# Patient Record
Sex: Female | Born: 1940 | Race: White | Hispanic: No | Smoking: Never smoker
Health system: Southern US, Community
[De-identification: ages and names within clinical notes are randomized; demographics above are authoritative.]

## PROBLEM LIST (undated history)

## (undated) DIAGNOSIS — M179 Osteoarthritis of knee, unspecified: Secondary | ICD-10-CM

## (undated) DIAGNOSIS — E041 Nontoxic single thyroid nodule: Secondary | ICD-10-CM

## (undated) DIAGNOSIS — R739 Hyperglycemia, unspecified: Secondary | ICD-10-CM

## (undated) DIAGNOSIS — F32A Depression, unspecified: Secondary | ICD-10-CM

## (undated) DIAGNOSIS — I10 Essential (primary) hypertension: Secondary | ICD-10-CM

## (undated) DIAGNOSIS — M109 Gout, unspecified: Secondary | ICD-10-CM

## (undated) DIAGNOSIS — F419 Anxiety disorder, unspecified: Secondary | ICD-10-CM

## (undated) DIAGNOSIS — M12819 Other specific arthropathies, not elsewhere classified, unspecified shoulder: Secondary | ICD-10-CM

## (undated) DIAGNOSIS — F329 Major depressive disorder, single episode, unspecified: Secondary | ICD-10-CM

## (undated) DIAGNOSIS — I517 Cardiomegaly: Secondary | ICD-10-CM

## (undated) DIAGNOSIS — J8410 Pulmonary fibrosis, unspecified: Secondary | ICD-10-CM

## (undated) DIAGNOSIS — H9190 Unspecified hearing loss, unspecified ear: Secondary | ICD-10-CM

## (undated) DIAGNOSIS — I8390 Asymptomatic varicose veins of unspecified lower extremity: Secondary | ICD-10-CM

## (undated) DIAGNOSIS — R42 Dizziness and giddiness: Secondary | ICD-10-CM

## (undated) DIAGNOSIS — M1710 Unilateral primary osteoarthritis, unspecified knee: Secondary | ICD-10-CM

## (undated) DIAGNOSIS — I4891 Unspecified atrial fibrillation: Secondary | ICD-10-CM

## (undated) DIAGNOSIS — J309 Allergic rhinitis, unspecified: Secondary | ICD-10-CM

## (undated) DIAGNOSIS — M779 Enthesopathy, unspecified: Secondary | ICD-10-CM

## (undated) DIAGNOSIS — E782 Mixed hyperlipidemia: Secondary | ICD-10-CM

## (undated) DIAGNOSIS — E669 Obesity, unspecified: Secondary | ICD-10-CM

## (undated) DIAGNOSIS — G4710 Hypersomnia, unspecified: Secondary | ICD-10-CM

## (undated) DIAGNOSIS — I340 Nonrheumatic mitral (valve) insufficiency: Secondary | ICD-10-CM

## (undated) DIAGNOSIS — E559 Vitamin D deficiency, unspecified: Secondary | ICD-10-CM

## (undated) DIAGNOSIS — D649 Anemia, unspecified: Secondary | ICD-10-CM

## (undated) DIAGNOSIS — R918 Other nonspecific abnormal finding of lung field: Secondary | ICD-10-CM

## (undated) DIAGNOSIS — I6529 Occlusion and stenosis of unspecified carotid artery: Secondary | ICD-10-CM

## (undated) HISTORY — DX: Other specific arthropathies, not elsewhere classified, unspecified shoulder: M12.819

## (undated) HISTORY — DX: Osteoarthritis of knee, unspecified: M17.9

## (undated) HISTORY — DX: Pulmonary fibrosis, unspecified: J84.10

## (undated) HISTORY — PX: APPENDECTOMY: SHX54

## (undated) HISTORY — DX: Dizziness and giddiness: R42

## (undated) HISTORY — DX: Other nonspecific abnormal finding of lung field: R91.8

## (undated) HISTORY — DX: Hypersomnia, unspecified: G47.10

## (undated) HISTORY — DX: Unspecified hearing loss, unspecified ear: H91.90

## (undated) HISTORY — DX: Mixed hyperlipidemia: E78.2

## (undated) HISTORY — DX: Occlusion and stenosis of unspecified carotid artery: I65.29

## (undated) HISTORY — DX: Nontoxic single thyroid nodule: E04.1

## (undated) HISTORY — DX: Cardiomegaly: I51.7

## (undated) HISTORY — DX: Unilateral primary osteoarthritis, unspecified knee: M17.10

## (undated) HISTORY — DX: Vitamin D deficiency, unspecified: E55.9

## (undated) HISTORY — DX: Unspecified atrial fibrillation: I48.91

## (undated) HISTORY — DX: Allergic rhinitis, unspecified: J30.9

## (undated) HISTORY — DX: Obesity, unspecified: E66.9

## (undated) HISTORY — DX: Major depressive disorder, single episode, unspecified: F32.9

## (undated) HISTORY — DX: Anxiety disorder, unspecified: F41.9

## (undated) HISTORY — DX: Hyperglycemia, unspecified: R73.9

## (undated) HISTORY — PX: MELANOMA EXCISION: SHX5266

## (undated) HISTORY — DX: Enthesopathy, unspecified: M77.9

## (undated) HISTORY — DX: Gout, unspecified: M10.9

## (undated) HISTORY — DX: Depression, unspecified: F32.A

## (undated) HISTORY — DX: Essential (primary) hypertension: I10

## (undated) HISTORY — DX: Nonrheumatic mitral (valve) insufficiency: I34.0

## (undated) HISTORY — DX: Asymptomatic varicose veins of unspecified lower extremity: I83.90

## (undated) HISTORY — DX: Anemia, unspecified: D64.9

---

## 1998-08-12 ENCOUNTER — Emergency Department (HOSPITAL_COMMUNITY): Admission: EM | Admit: 1998-08-12 | Discharge: 1998-08-12 | Payer: Self-pay | Admitting: Emergency Medicine

## 2014-08-11 DIAGNOSIS — M179 Osteoarthritis of knee, unspecified: Secondary | ICD-10-CM | POA: Insufficient documentation

## 2014-08-11 DIAGNOSIS — M1712 Unilateral primary osteoarthritis, left knee: Secondary | ICD-10-CM | POA: Insufficient documentation

## 2014-08-11 DIAGNOSIS — M75102 Unspecified rotator cuff tear or rupture of left shoulder, not specified as traumatic: Secondary | ICD-10-CM | POA: Insufficient documentation

## 2014-08-11 DIAGNOSIS — M1711 Unilateral primary osteoarthritis, right knee: Secondary | ICD-10-CM | POA: Insufficient documentation

## 2016-07-12 DIAGNOSIS — I890 Lymphedema, not elsewhere classified: Secondary | ICD-10-CM | POA: Insufficient documentation

## 2016-11-15 ENCOUNTER — Encounter: Payer: Self-pay | Admitting: Family Medicine

## 2016-11-15 ENCOUNTER — Ambulatory Visit (INDEPENDENT_AMBULATORY_CARE_PROVIDER_SITE_OTHER): Payer: MEDICARE | Admitting: Family Medicine

## 2016-11-15 DIAGNOSIS — M545 Low back pain: Secondary | ICD-10-CM

## 2016-11-15 DIAGNOSIS — G8929 Other chronic pain: Secondary | ICD-10-CM | POA: Diagnosis not present

## 2016-11-15 NOTE — Patient Instructions (Signed)
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 Ok to take tylenol for baseline pain relief (1-2 extra strength tabs 3x/day) Aleve 1-2 tabs twice a day with food for pain and inflammation - if this isn't helping you as much as the ibuprofen you can go back to taking this. Stay as active as possible. Do home exercises and stretches as directed - hold each for 20-30 seconds and do each one three times. Consider massage, chiropractor, physical therapy, and/or acupuncture. Physical therapy has been shown to be helpful while the others have mixed results - let me know if you want to do physical therapy before I see you back. Strengthening of low back muscles, abdominal musculature are key for long term pain relief. Follow up with me in 5-6 weeks.

## 2016-11-17 ENCOUNTER — Encounter: Payer: Self-pay | Admitting: Family Medicine

## 2016-11-17 DIAGNOSIS — M545 Low back pain, unspecified: Secondary | ICD-10-CM | POA: Insufficient documentation

## 2016-11-17 NOTE — Assessment & Plan Note (Signed)
history and exam suggest degenerative changes with lumbar spasms.  Discussed options - she would like to start with home exercise program which was reviewed today.  Discussed tylenol, aleve as well.  Consider physical therapy if not improving as expected.  Consider radiographs as well.  F/u in 5-6 weeks.

## 2016-11-17 NOTE — Progress Notes (Signed)
PCP: Coralee Ruduran, Michael R, PA-C  Subjective:   HPI: Patient is a 76 y.o. female here for low back pain.  Patient reports she's had about 1 year of low back pain. Seems to be worse since over the summer. Bothers with all movements, worse in the morning. No acute trauma or injury. Better with sitting. Pain is 0/10 currently but can be up to 10/10 and sharp. No radiation into legs. No numbness or tingling. No bowel/bladder dysfunction. Had x-rays several years ago but unsure of results. Tried ibuprofen. No history of osteoporosis and reports bone densities have been very good.  Past Medical History:  Diagnosis Date  . Depression   . Hypertension     No current outpatient prescriptions on file prior to visit.   No current facility-administered medications on file prior to visit.     No past surgical history on file.  Allergies  Allergen Reactions  . Statins   . Sulfa Antibiotics     Social History   Social History  . Marital status: Divorced    Spouse name: N/A  . Number of children: N/A  . Years of education: N/A   Occupational History  . Not on file.   Social History Main Topics  . Smoking status: Never Smoker  . Smokeless tobacco: Never Used  . Alcohol use Not on file  . Drug use: Unknown  . Sexual activity: Not on file   Other Topics Concern  . Not on file   Social History Narrative  . No narrative on file    No family history on file.  BP 131/84   Pulse 76   Ht 5\' 7"  (1.702 m)   Wt 230 lb (104.3 kg)   BMI 36.02 kg/m   Review of Systems: See HPI above.     Objective:  Physical Exam:  Gen: NAD, comfortable in exam room  Back: No gross deformity, scoliosis. TTP minimally paraspinal lumbar regions.  No midline or bony TTP. FROM. Strength LEs 5/5 all muscle groups.   2+ MSRs in patellar and achilles tendons, equal bilaterally. Negative SLRs. Sensation intact to light touch bilaterally. Negative logroll bilateral hips Negative fabers  and piriformis stretches.   Assessment & Plan:  1. Low back pain - history and exam suggest degenerative changes with lumbar spasms.  Discussed options - she would like to start with home exercise program which was reviewed today.  Discussed tylenol, aleve as well.  Consider physical therapy if not improving as expected.  Consider radiographs as well.  F/u in 5-6 weeks.

## 2016-12-20 ENCOUNTER — Ambulatory Visit (INDEPENDENT_AMBULATORY_CARE_PROVIDER_SITE_OTHER): Payer: MEDICARE | Admitting: Family Medicine

## 2016-12-20 ENCOUNTER — Encounter: Payer: Self-pay | Admitting: Family Medicine

## 2016-12-20 DIAGNOSIS — G8929 Other chronic pain: Secondary | ICD-10-CM | POA: Diagnosis not present

## 2016-12-20 DIAGNOSIS — M545 Low back pain, unspecified: Secondary | ICD-10-CM

## 2016-12-20 NOTE — Patient Instructions (Signed)
Continue home exercises for 4-6 more weeks then do them as needed. Call me if you have any problems otherwise follow up as needed.

## 2016-12-22 NOTE — Assessment & Plan Note (Signed)
2/2 degenerative changes with lumbar spasms.  Doing well with home exercise program - continue this for another 4-6 weeks.  Tylenol, aleve only if needed.  Consider physical therapy if not improving.  F/u in 5-6 weeks or as needed.

## 2016-12-22 NOTE — Progress Notes (Signed)
PCP: Coralee Rud, PA-C  Subjective:   HPI: Patient is a 76 y.o. female here for low back pain.  8/28: Patient reports she's had about 1 year of low back pain. Seems to be worse since over the summer. Bothers with all movements, worse in the morning. No acute trauma or injury. Better with sitting. Pain is 0/10 currently but can be up to 10/10 and sharp. No radiation into legs. No numbness or tingling. No bowel/bladder dysfunction. Had x-rays several years ago but unsure of results. Tried ibuprofen. No history of osteoporosis and reports bone densities have been very good.  10/2: Patient reports she feels about 90-95% improved from last visit. Gets some soreness only in low back if she gets tired from doing housework, being stressed. Takes aleve or celebrex. Pain level now 0/10. No skin changes, numbness. No bowel/bladder dysfunction.  Past Medical History:  Diagnosis Date  . Depression   . Hypertension     Current Outpatient Prescriptions on File Prior to Visit  Medication Sig Dispense Refill  . furosemide (LASIX) 20 MG tablet     . montelukast (SINGULAIR) 10 MG tablet Take 10 mg by mouth daily.  6  . olmesartan (BENICAR) 40 MG tablet Take 40 mg by mouth daily.  3  . potassium chloride SA (K-DUR,KLOR-CON) 20 MEQ tablet Take 20 mEq by mouth daily.  1  . predniSONE (DELTASONE) 5 MG tablet Take 5 mg by mouth daily.  2  . sertraline (ZOLOFT) 100 MG tablet Take 100 mg by mouth daily.  3   No current facility-administered medications on file prior to visit.     No past surgical history on file.  Allergies  Allergen Reactions  . Statins   . Sulfa Antibiotics     Social History   Social History  . Marital status: Divorced    Spouse name: N/A  . Number of children: N/A  . Years of education: N/A   Occupational History  . Not on file.   Social History Main Topics  . Smoking status: Never Smoker  . Smokeless tobacco: Never Used  . Alcohol use Not on file   . Drug use: Unknown  . Sexual activity: Not on file   Other Topics Concern  . Not on file   Social History Narrative  . No narrative on file    No family history on file.  BP (!) 159/69   Pulse 82   Ht  (1.702 m)   Wt 230 lb (104.3 kg)   BMI 36.02 kg/m   Review of Systems: See HPI above.     Objective:  Physical Exam:  Gen: NAD, comfortable in exam room  Back: No gross deformity, scoliosis. No TTP currently.  No midline or bony TTP. FROM. Strength LEs 5/5 all muscle groups.   2+ MSRs in patellar and achilles tendons, equal bilaterally. Negative SLRs. Sensation intact to light touch bilaterally. Negative logroll bilateral hips   Assessment & Plan:  1. Low back pain - 2/2 degenerative changes with lumbar spasms.  Doing well with home exercise program - continue this for another 4-6 weeks.  Tylenol, aleve only if needed.  Consider physical therapy if not improving.  F/u in 5-6 weeks or as needed.

## 2018-02-12 ENCOUNTER — Telehealth: Payer: Self-pay

## 2018-02-12 NOTE — Telephone Encounter (Signed)
SENT REFERRAL TO SCHEDULING AND FILED NOTES 

## 2018-03-06 ENCOUNTER — Institutional Professional Consult (permissible substitution): Payer: MEDICARE | Admitting: Cardiology

## 2018-03-07 ENCOUNTER — Encounter: Payer: Self-pay | Admitting: Internal Medicine

## 2018-03-07 ENCOUNTER — Other Ambulatory Visit: Payer: Self-pay

## 2018-03-07 ENCOUNTER — Ambulatory Visit (INDEPENDENT_AMBULATORY_CARE_PROVIDER_SITE_OTHER): Payer: MEDICARE | Admitting: Internal Medicine

## 2018-03-07 VITALS — BP 124/80 | HR 62 | Ht 67.0 in | Wt 222.6 lb

## 2018-03-07 DIAGNOSIS — I10 Essential (primary) hypertension: Secondary | ICD-10-CM

## 2018-03-07 DIAGNOSIS — I480 Paroxysmal atrial fibrillation: Secondary | ICD-10-CM | POA: Diagnosis not present

## 2018-03-07 NOTE — Patient Instructions (Signed)
Medication Instructions:  Your physician has recommended you make the following change in your medication:  1. STOP Aspirin  * If you need a refill on your cardiac medications before your next appointment, please call your pharmacy.   Labwork: None ordered   Testing/Procedures: None ordered  Follow-Up: Your physician recommends that you schedule a follow-up appointment in: 6 weeks in the AFib clinic.   Thank you for choosing [facility]!!

## 2018-03-07 NOTE — Progress Notes (Signed)
Electrophysiology Office Note   Date:  03/07/2018   ID:  INEZE SERRAO, DOB 07-Aug-1940, MRN [id number]  PCP:  Ronnald Collum    Primary Electrophysiologist: Hillis Range, MD    Chief Complaint  Patient presents with  . Atrial Fibrillation     History of Present Illness: Jennifer Hardin is a 77 y.o. female who presents today for electrophysiology evaluation.   She was diagnosed with afib in November.  She reports symptoms of palpitations, fatigue and SOB.  She was started on eliquis.  She has a h/o htn, obesity.  Denies rheumatic fever.  She has sleep apnea workup ongoing. She is in sinus today but unaware.  She seems to have some difficulty determining symptoms of afib.  Today, she denies symptoms of palpitations, chest pain, shortness of breath, orthopnea, PND, lower extremity edema, claudication, dizziness, presyncope, syncope, bleeding, or neurologic sequela. The patient is tolerating medications without difficulties and is otherwise without complaint today.    Past Medical History:  Diagnosis Date  . Allergic rhinitis   . Anemia   . Anxiety   . Asymptomatic varicose veins   . Atrial fibrillation (HCC)   . Carotid stenosis    very mild (<39% by doppler 10/24/17 at [facility])  . Depression   . Gout   . Granulomatous lung disease (HCC)   . Hearing loss    bilateral  . Hyperglycemia   . Hypersomnia   . Hypertension    benign  . Left atrial enlargement   . Mixed hyperlipidemia   . MR (mitral regurgitation)   . Obesity   . Osteoarthritis, knee    bilateral  . Pulmonary nodules   . Recurrent vertigo   . Rotator cuff arthropathy   . Tendonitis    of elbow or forearm  . Thyroid nodule   . Vitamin D deficiency    Past Surgical History:  Procedure Laterality Date  . APPENDECTOMY    . MELANOMA EXCISION     ON BACK     Current Outpatient Medications  Medication Sig Dispense Refill  . apixaban (ELIQUIS) 5 MG TABS tablet Take 5 mg by mouth 2 (two) times  daily.    Marland Kitchen aspirin 81 MG chewable tablet Chew 81 mg by mouth daily.    . celecoxib (CELEBREX) 100 MG capsule Take 100 mg by mouth daily.    . Magnesium 250 MG TABS Take 1 tablet by mouth daily.    . montelukast (SINGULAIR) 10 MG tablet Take 10 mg by mouth daily.  6  . Multiple Vitamin (MULTIVITAMIN) tablet Take 1 tablet by mouth daily.    . nebivolol (BYSTOLIC) 5 MG tablet Take 5 mg by mouth daily.    Marland Kitchen olmesartan (BENICAR) 40 MG tablet Take 40 mg by mouth daily.  3  . Omega-3 Fatty Acids (FISH OIL) 1200 MG CAPS Take 1 capsule by mouth daily.    . potassium chloride SA (K-DUR,KLOR-CON) 20 MEQ tablet Take 20 mEq by mouth daily.  1  . predniSONE (DELTASONE) 5 MG tablet Take 5 mg by mouth daily.  2  . sertraline (ZOLOFT) 100 MG tablet Take 100 mg by mouth daily.  3   No current facility-administered medications for this visit.     Allergies:   Bee venom; Statins; and Sulfa antibiotics   Social History:  The patient  reports that she has never smoked. She has never used smokeless tobacco. She reports that she does not drink alcohol or use drugs.  Family History:  The patient's family history includes Hyperlipidemia in her sister; Hypertension in her brother.    ROS:  Please see the history of present illness.   All other systems are personally reviewed and negative.    PHYSICAL EXAM: VS:  BP 124/80   Pulse 62   Ht 5\' 7"  (1.702 m)   Wt 222 lb 9.6 oz (101 kg)   SpO2 98%   BMI 34.86 kg/m  , BMI Body mass index is 34.86 kg/m. GEN: overweight, in no acute distress  HEENT: normal  Neck: no JVD, carotid bruits, or masses Cardiac: RRR; no murmurs, rubs, or gallops,no edema  Respiratory:  clear to auscultation bilaterally, normal work of breathing GI: soft, nontender, nondistended, + BS MS: no deformity or atrophy  Skin: warm and dry  Neuro:  Strength and sensation are intact Psych: euthymic mood, full affect  EKG:  EKG is ordered today. The ekg ordered today is personally  reviewed and shows sinus rhythm with PACs, QTc 430 msec   Recent Labs: No results found for requested labs within last 8760 hours.  personally reviewed   Lipid Panel  No results found for: CHOL, TRIG, HDL, CHOLHDL, VLDL, LDLCALC, LDLDIRECT personally reviewed   Wt Readings from Last 3 Encounters:  03/07/18 222 lb 9.6 oz (101 kg)  12/20/16 230 lb (104.3 kg)  11/15/16 230 lb (104.3 kg)     Other studies personally reviewed: Additional studies/ records that were reviewed today include: Forde RadonMike Duran's notes, prior echo  Review of the above records today demonstrates: echo 01/26/18 reveals EF 60%, mild LVH, trace MR, LA 51mm' Myoview 01/30/18 reveals normal wall motion, no ischemia   ASSESSMENT AND PLAN:  1.  Paroxysmal atrial fibrillation Single episode in November in the setting of steroid taper and inhalers.  Currently in sinus and doing well.  Given her morbid obesity, age and LA size, I anticipate that she may have additional AF in the future.  We discussed lifestyle modification at length today.  chads2vasc score is at least 4.  She is on eliquis Stop ASA  2. HTN Stable No change required today  Follow-up:  AF clinic in 6 weeks.  I would reserve AAD therapy for more symptomatic and recurrent episodes.  Flecainide would be a reasonable option at that time.  Current medicines are reviewed at length with the patient today.   The patient does not have concerns regarding her medicines.  The following changes were made today:  none    Signed, Hillis RangeJames Charma Mocarski, MD  03/07/2018 3:54 PM     [facility] [address] [place] [address] [phone] (office) [phone] (fax)

## 2018-04-16 ENCOUNTER — Encounter (HOSPITAL_COMMUNITY): Payer: Self-pay | Admitting: Nurse Practitioner

## 2018-04-16 ENCOUNTER — Ambulatory Visit (HOSPITAL_COMMUNITY)
Admission: RE | Admit: 2018-04-16 | Discharge: 2018-04-16 | Disposition: A | Discharge status: Home / Self Care | Payer: MEDICARE | Source: Ambulatory Visit | Attending: Nurse Practitioner | Admitting: Nurse Practitioner

## 2018-04-16 VITALS — BP 138/86 | HR 53 | Ht 67.0 in | Wt 219.0 lb

## 2018-04-16 DIAGNOSIS — I10 Essential (primary) hypertension: Secondary | ICD-10-CM | POA: Diagnosis not present

## 2018-04-16 DIAGNOSIS — F419 Anxiety disorder, unspecified: Secondary | ICD-10-CM | POA: Insufficient documentation

## 2018-04-16 DIAGNOSIS — Z888 Allergy status to other drugs, medicaments and biological substances status: Secondary | ICD-10-CM | POA: Diagnosis not present

## 2018-04-16 DIAGNOSIS — Z7901 Long term (current) use of anticoagulants: Secondary | ICD-10-CM | POA: Diagnosis not present

## 2018-04-16 DIAGNOSIS — I480 Paroxysmal atrial fibrillation: Secondary | ICD-10-CM | POA: Diagnosis present

## 2018-04-16 DIAGNOSIS — M109 Gout, unspecified: Secondary | ICD-10-CM | POA: Diagnosis not present

## 2018-04-16 DIAGNOSIS — Z882 Allergy status to sulfonamides status: Secondary | ICD-10-CM | POA: Diagnosis not present

## 2018-04-16 DIAGNOSIS — E782 Mixed hyperlipidemia: Secondary | ICD-10-CM | POA: Diagnosis not present

## 2018-04-16 DIAGNOSIS — I6529 Occlusion and stenosis of unspecified carotid artery: Secondary | ICD-10-CM | POA: Diagnosis not present

## 2018-04-16 DIAGNOSIS — Z79899 Other long term (current) drug therapy: Secondary | ICD-10-CM | POA: Insufficient documentation

## 2018-04-16 DIAGNOSIS — Z91030 Bee allergy status: Secondary | ICD-10-CM | POA: Diagnosis not present

## 2018-04-16 DIAGNOSIS — D649 Anemia, unspecified: Secondary | ICD-10-CM | POA: Insufficient documentation

## 2018-04-16 DIAGNOSIS — J984 Other disorders of lung: Secondary | ICD-10-CM | POA: Diagnosis not present

## 2018-04-16 DIAGNOSIS — Z8249 Family history of ischemic heart disease and other diseases of the circulatory system: Secondary | ICD-10-CM | POA: Diagnosis not present

## 2018-04-16 NOTE — Progress Notes (Signed)
Primary Care Physician: Coralee Rud, PA-C Referring Physician:Dr. Bary Richard Jennifer Hardin is a 78 y.o. female with a h/o paroxysmal afib in November in the setting of steroid taper and inhalers. She had f/u with Dr. Johney Frame and was in SR. He wanted to hold off on antiarrythmic's for more symptomatic, and recurrent episodes.  She reports today that she has not had any more episodes. She is doing well on her eliquis without any issues with bleeding.   Today, she denies symptoms of palpitations, chest pain, shortness of breath, orthopnea, PND, lower extremity edema, dizziness, presyncope, syncope, or neurologic sequela. The patient is tolerating medications without difficulties and is otherwise without complaint today.   Past Medical History:  Diagnosis Date  . Allergic rhinitis   . Anemia   . Anxiety   . Asymptomatic varicose veins   . Atrial fibrillation (HCC)   . Carotid stenosis    very mild (<39% by doppler 10/24/17 at [facility])  . Depression   . Gout   . Granulomatous lung disease (HCC)   . Hearing loss    bilateral  . Hyperglycemia   . Hypersomnia   . Hypertension    benign  . Left atrial enlargement   . Mixed hyperlipidemia   . MR (mitral regurgitation)   . Obesity   . Osteoarthritis, knee    bilateral  . Pulmonary nodules   . Recurrent vertigo   . Rotator cuff arthropathy   . Tendonitis    of elbow or forearm  . Thyroid nodule   . Vitamin D deficiency    Past Surgical History:  Procedure Laterality Date  . APPENDECTOMY    . MELANOMA EXCISION     ON BACK    Current Outpatient Medications  Medication Sig Dispense Refill  . apixaban (ELIQUIS) 5 MG TABS tablet Take 5 mg by mouth 2 (two) times daily.    . Magnesium 250 MG TABS Take 1 tablet by mouth daily.    . montelukast (SINGULAIR) 10 MG tablet Take 10 mg by mouth daily.  6  . Multiple Vitamin (MULTIVITAMIN) tablet Take 1 tablet by mouth daily.    . nebivolol (BYSTOLIC) 5 MG tablet Take 5 mg by mouth  daily.    Marland Kitchen olmesartan (BENICAR) 40 MG tablet Take 40 mg by mouth daily.  3  . Omega-3 Fatty Acids (FISH OIL) 1200 MG CAPS Take 1 capsule by mouth daily.    . potassium chloride SA (K-DUR,KLOR-CON) 20 MEQ tablet Take 20 mEq by mouth daily.  1  . predniSONE (DELTASONE) 5 MG tablet Take 5 mg by mouth daily.  2  . sertraline (ZOLOFT) 100 MG tablet Take 100 mg by mouth daily.  3  . Turmeric 400 MG CAPS Take 2 capsules by mouth daily.     No current facility-administered medications for this encounter.     Allergies  Allergen Reactions  . Bee Venom     Edema   . Statins   . Sulfa Antibiotics     Social History   Socioeconomic History  . Marital status: Divorced    Spouse name: Not on file  . Number of children: Not on file  . Years of education: Not on file  . Highest education level: Not on file  Occupational History  . Not on file  Social Needs  . Financial resource strain: Not on file  . Food insecurity:    Worry: Not on file    Inability: Not on file  .  Transportation needs:    Medical: Not on file    Non-medical: Not on file  Tobacco Use  . Smoking status: Never Smoker  . Smokeless tobacco: Never Used  Substance and Sexual Activity  . Alcohol use: Never    Frequency: Never  . Drug use: Never  . Sexual activity: Not on file  Lifestyle  . Physical activity:    Days per week: Not on file    Minutes per session: Not on file  . Stress: Not on file  Relationships  . Social connections:    Talks on phone: Not on file    Gets together: Not on file    Attends religious service: Not on file    Active member of club or organization: Not on file    Attends meetings of clubs or organizations: Not on file    Relationship status: Not on file  . Intimate partner violence:    Fear of current or ex partner: Not on file    Emotionally abused: Not on file    Physically abused: Not on file    Forced sexual activity: Not on file  Other Topics Concern  . Not on file  Social  History Narrative   [place]   Substitute teacher    Family History  Problem Relation Age of Onset  . Hyperlipidemia Sister   . Hypertension Brother     ROS- All systems are reviewed and negative except as per the HPI above  Physical Exam: Vitals:   04/16/18 1333  BP: 138/86  Pulse: (!) 53  SpO2: 92%  Weight: 99.3 kg  Height: 5\' 7"  (1.702 m)   Wt Readings from Last 3 Encounters:  04/16/18 99.3 kg  03/07/18 101 kg  12/20/16 104.3 kg    Labs: No results found for: NA, K, CL, CO2, GLUCOSE, BUN, CREATININE, CALCIUM, PHOS, MG No results found for: INR No results found for: CHOL, HDL, LDLCALC, TRIG   GEN- The patient is well appearing, alert and oriented x 3 today.   Head- normocephalic, atraumatic Eyes-  Sclera clear, conjunctiva pink Ears- hearing intact Oropharynx- clear Neck- supple, no JVP Lymph- no cervical lymphadenopathy Lungs- Clear to ausculation bilaterally, normal work of breathing Heart- Regular rate and rhythm, no murmurs, rubs or gallops, PMI not laterally displaced GI- soft, NT, ND, + BS Extremities- no clubbing, cyanosis, or edema MS- no significant deformity or atrophy Skin- no rash or lesion Psych- euthymic mood, full affect Neuro- strength and sensation are intact  EKG-Sinus brady at 53 bpm, pr int 166 ms, qrs int 84 ms, qtc 394 ms   Assessment and Plan: 1.Paroxysmal afib previously in the setting of steroids and inhalers Pt not aware of any recurrent episodes Dr. Johney FrameAllred suggested flecainide if needed Continue Bystolic at 5 mg daily  2. CHA2DS2VASc score of 4 Continue eliquis 5 mg bid   F/u with PCP as scheduled and afib clinic as needed  Lupita LeashDonna C. Matthew Folksarroll, ANP-C Afib Clinic [facility] [place] [address] [place], [place] [address] [phone]

## 2019-07-22 ENCOUNTER — Telehealth: Payer: Self-pay | Admitting: Allergy and Immunology

## 2019-07-22 NOTE — Telephone Encounter (Signed)
Scheduled new patient appt for 6/10. Pt reports she is currently taking low dose predisone for years. Does she have to stop in order to be tested at new pt appt?

## 2019-07-22 NOTE — Telephone Encounter (Signed)
What condition is she taking the prednisone for? Thanks.

## 2019-07-29 ENCOUNTER — Encounter: Payer: Self-pay | Admitting: Allergy and Immunology

## 2019-07-29 ENCOUNTER — Ambulatory Visit (INDEPENDENT_AMBULATORY_CARE_PROVIDER_SITE_OTHER): Payer: MEDICARE | Admitting: Allergy and Immunology

## 2019-07-29 ENCOUNTER — Telehealth: Payer: Self-pay | Admitting: *Deleted

## 2019-07-29 ENCOUNTER — Other Ambulatory Visit: Payer: Self-pay

## 2019-07-29 VITALS — BP 136/80 | HR 63 | Temp 97.3°F | Resp 18 | Ht 63.0 in | Wt 225.4 lb

## 2019-07-29 DIAGNOSIS — J320 Chronic maxillary sinusitis: Secondary | ICD-10-CM | POA: Diagnosis not present

## 2019-07-29 DIAGNOSIS — J3089 Other allergic rhinitis: Secondary | ICD-10-CM | POA: Diagnosis not present

## 2019-07-29 DIAGNOSIS — R42 Dizziness and giddiness: Secondary | ICD-10-CM | POA: Diagnosis not present

## 2019-07-29 DIAGNOSIS — J329 Chronic sinusitis, unspecified: Secondary | ICD-10-CM | POA: Insufficient documentation

## 2019-07-29 DIAGNOSIS — H6983 Other specified disorders of Eustachian tube, bilateral: Secondary | ICD-10-CM | POA: Insufficient documentation

## 2019-07-29 DIAGNOSIS — H6993 Unspecified Eustachian tube disorder, bilateral: Secondary | ICD-10-CM

## 2019-07-29 MED ORDER — AZELASTINE-FLUTICASONE 137-50 MCG/ACT NA SUSP: 1.0000 | Freq: Two times a day (BID) | NASAL | 2 refills | Status: AC | PRN

## 2019-07-29 NOTE — Assessment & Plan Note (Signed)
   Treatment plan as outlined above for mixed rhinitis.  Continue prednisone as previously prescribed.  If problems persist or progress despite treatment plan as outlined above.  I have recommended seeing Dr. Suszanne Conners (otolaryngology) for further evaluation/treatment.

## 2019-07-29 NOTE — Assessment & Plan Note (Signed)
   Treatment plan as above.

## 2019-07-29 NOTE — Patient Instructions (Addendum)
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  Intradermal testing revealed some reactivity to major mold mix #4.  Based on the skin test results, I suspect that her symptoms are primarily nonallergic.  Aeroallergen avoidance measures have been discussed and provided in written form.  A prescription has been provided for azelastine/fluticasone nasal spray, 1 spray per nostril twice daily as needed. Proper nasal spray technique has been discussed and demonstrated.  Nasal saline lavage (NeilMed) has been recommended as needed and prior to medicated nasal sprays along with instructions for proper administration.  For thick post nasal drainage, add guaifenesin 600 mg (Mucinex)  twice daily as needed with adequate hydration as discussed.  Chronic sinusitis/eustachian tube dysfunction  Treatment plan as outlined above for mixed rhinitis.  Continue prednisone as previously prescribed.  If problems persist or progress despite treatment plan as outlined above.  I have recommended seeing Dr. Suszanne Conners (otolaryngology) for further evaluation/treatment.  Dizziness  Treatment plan as above.   If symptoms persist or progress despite treatment plan, see otolaryngologist (Dr. Suszanne Conners). Otherwise may follow up with me in 3 months or sooner if needed.  Control of Mold Allergen  Mold and fungi can grow on a variety of surfaces provided certain temperature and moisture conditions exist.  Outdoor molds grow on plants, decaying vegetation and soil.  The major outdoor mold, Alternaria and Cladosporium, are found in very high numbers during hot and dry conditions.  Generally, a late Summer - Fall peak is seen for common outdoor fungal spores.  Rain will temporarily lower outdoor mold spore count, but counts rise rapidly when the rainy period ends.  The most important indoor molds are Aspergillus and  Penicillium.  Dark, humid and poorly ventilated basements are ideal sites for mold growth.  The next most common sites of mold growth are the bathroom and the kitchen.  Outdoor [employer] 1. Use air conditioning and keep windows closed 2. Avoid exposure to decaying vegetation. 3. Avoid leaf raking. 4. Avoid grain handling. 5. Consider wearing a face mask if working in moldy areas.  Indoor Mold Control 1. Maintain humidity below 50%. 2. Clean washable surfaces with 5% bleach solution. 3. Remove sources e.g. Contaminated carpets.

## 2019-07-29 NOTE — Assessment & Plan Note (Signed)
Epicutaneous environmental tests were negative today despite a positive histamine control.  Intradermal testing revealed some reactivity to major mold mix #4.  Based on the skin test results, I suspect that her symptoms are primarily nonallergic.  Aeroallergen avoidance measures have been discussed and provided in written form.  A prescription has been provided for azelastine/fluticasone nasal spray, 1 spray per nostril twice daily as needed. Proper nasal spray technique has been discussed and demonstrated.  Nasal saline lavage (NeilMed) has been recommended as needed and prior to medicated nasal sprays along with instructions for proper administration.  For thick post nasal drainage, add guaifenesin 600 mg (Mucinex)  twice daily as needed with adequate hydration as discussed.

## 2019-07-29 NOTE — Telephone Encounter (Signed)
Ambulatory referral has been ordered for patient to see Dr. Suszanne Conners ENT for Eustachian tube dysfunction, chronic sinusitis, and dizziness.

## 2019-07-29 NOTE — Progress Notes (Signed)
New Patient Note  RE: Jennifer Hardin MRN: [id number] DOB: 21-Apr-1940 Date of Office Visit: 07/29/2019  Referring provider: Gwendel Hanson Primary care provider: Secundino Ginger, PA-C  Chief Complaint: Nasal congestion, sinus pressure, ear pressure, ringing in the ears, dizziness  History of present illness: Jennifer Hardin is a 79 y.o. female seen today in consultation requested by Isaias Cowman, PA-C.  She complains of persistent nasal congestion and ear congestion with "noise" in her head "like a fan running, just roaring in my head."  She also complains of occasional dizziness.  She reports that rhinorrhea had been "constant", however now "everything got clogged up."  These symptoms occur year-round but have been particularly troublesome over the past 2 months.  She has been taking prednisone 10 mg daily for over the past year in an attempt to control the symptoms.  In addition, she is using fluticasone nasal spray.  She had been on immunotherapy injections in the 1970s while living in [place].  She has not had skin testing performed in the interval since that time.  Assessment and plan: Perennial allergic rhinitis with predominant nonallergic component Epicutaneous environmental tests were negative today despite a positive histamine control.  Intradermal testing revealed some reactivity to major mold mix #4.  Based on the skin test results, I suspect that her symptoms are primarily nonallergic.  Aeroallergen avoidance measures have been discussed and provided in written form.  A prescription has been provided for azelastine/fluticasone nasal spray, 1 spray per nostril twice daily as needed. Proper nasal spray technique has been discussed and demonstrated.  Nasal saline lavage (NeilMed) has been recommended as needed and prior to medicated nasal sprays along with instructions for proper administration.  For thick post nasal drainage, add guaifenesin 600 mg (Mucinex)  twice daily  as needed with adequate hydration as discussed.  Chronic sinusitis/eustachian tube dysfunction  Treatment plan as outlined above for mixed rhinitis.  Continue prednisone as previously prescribed.  If problems persist or progress despite treatment plan as outlined above.  I have recommended seeing Dr. Benjamine Mola (otolaryngology) for further evaluation/treatment.  Dizziness  Treatment plan as above.   Meds ordered this encounter  Medications  . Azelastine-Fluticasone 137-50 MCG/ACT SUSP    Sig: Place 1 spray into the nose 2 (two) times daily as needed.    Dispense:  23 g    Refill:  2    Diagnostics: Environmental skin testing: Negative despite a positive histamine control. Food allergen skin testing: Positive to perennial major mold mix #4.    Physical examination: Blood pressure 136/80, pulse 63, temperature (!) 97.3 F (36.3 C), temperature source Temporal, resp. rate 18, height 5\' 3"  (1.6 m), weight 225 lb 6.4 oz (102.2 kg), SpO2 95 %.  General: Alert, interactive, in no acute distress. HEENT: TMs pearly gray, turbinates moderately edematous with clear discharge, post-pharynx moderately erythematous. Neck: Supple without lymphadenopathy. Lungs: Clear to auscultation without wheezing, rhonchi or rales. CV: Normal S1, S2 without murmurs. Abdomen: Nondistended, nontender. Skin: Warm and dry, without lesions or rashes. Extremities:  No clubbing, cyanosis or edema. Neuro:   Grossly intact.  Review of systems:  Review of systems negative except as noted in HPI / PMHx or noted below: Review of Systems  Constitutional: Negative.   HENT: Negative.   Eyes: Negative.   Respiratory: Negative.   Cardiovascular: Negative.   Gastrointestinal: Negative.   Genitourinary: Negative.   Musculoskeletal: Negative.   Skin: Negative.   Neurological: Negative.   Endo/Heme/Allergies: Negative.  Psychiatric/Behavioral: Negative.     Past medical history:  Past Medical History:    Diagnosis Date  . Allergic rhinitis   . Anemia   . Anxiety   . Asymptomatic varicose veins   . Atrial fibrillation (HCC)   . Carotid stenosis    very mild (<39% by doppler 10/24/17 at [facility])  . Depression   . Gout   . Granulomatous lung disease (HCC)   . Hearing loss    bilateral  . Hyperglycemia   . Hypersomnia   . Hypertension    benign  . Left atrial enlargement   . Mixed hyperlipidemia   . MR (mitral regurgitation)   . Obesity   . Osteoarthritis, knee    bilateral  . Pulmonary nodules   . Recurrent vertigo   . Rotator cuff arthropathy   . Tendonitis    of elbow or forearm  . Thyroid nodule   . Vitamin D deficiency     Past surgical history:  Past Surgical History:  Procedure Laterality Date  . APPENDECTOMY    . MELANOMA EXCISION     ON BACK    Family history: Family History  Problem Relation Age of Onset  . Hyperlipidemia Sister   . Hypertension Brother     Social history: Social History   Socioeconomic History  . Marital status: Divorced    Spouse name: Not on file  . Number of children: Not on file  . Years of education: Not on file  . Highest education level: Not on file  Occupational History  . Not on file  Tobacco Use  . Smoking status: Never Smoker  . Smokeless tobacco: Never Used  Substance and Sexual Activity  . Alcohol use: Never  . Drug use: Never  . Sexual activity: Not on file  Other Topics Concern  . Not on file  Social History Narrative   [place] [facility]   Substitute teacher   Social Determinants of Health   Financial Resource Strain:   . Difficulty of Paying Living Expenses:   Food Insecurity:   . Worried About Programme researcher, broadcasting/film/video in the Last Year:   . Barista in the Last Year:   Transportation Needs:   . Freight forwarder (Medical):   Marland Kitchen Lack of Transportation (Non-Medical):   Physical Activity:   . Days of Exercise per Week:   . Minutes of Exercise per Session:   Stress:   . Feeling of Stress :    Social Connections:   . Frequency of Communication with Friends and Family:   . Frequency of Social Gatherings with Friends and Family:   . Attends Religious Services:   . Active Member of Clubs or Organizations:   . Attends Banker Meetings:   Marland Kitchen Marital Status:   Intimate Partner Violence:   . Fear of Current or Ex-Partner:   . Emotionally Abused:   Marland Kitchen Physically Abused:   . Sexually Abused:     Environmental History: The patient lives in a 81 year old house with carpeting throughout, gassy, and central air.  There is a dog in the home which has access to her bedroom.  There is no known mold/water damage in the home.  She is a non-smoker.  Current Outpatient Medications  Medication Sig Dispense Refill  . apixaban (ELIQUIS) 5 MG TABS tablet Take 5 mg by mouth 2 (two) times daily.    Marland Kitchen aspirin EC 81 MG tablet Take 81 mg by mouth daily.    Marland Kitchen  fluticasone (FLONASE) 50 MCG/ACT nasal spray Place 2 sprays into both nostrils daily.    . Magnesium 250 MG TABS Take 1 tablet by mouth daily.    . montelukast (SINGULAIR) 10 MG tablet Take 10 mg by mouth daily.  6  . Multiple Vitamin (MULTIVITAMIN) tablet Take 1 tablet by mouth daily.    . nebivolol (BYSTOLIC) 5 MG tablet Take 5 mg by mouth daily.    Marland Kitchen olmesartan (BENICAR) 40 MG tablet Take 40 mg by mouth daily.  3  . Omega-3 Fatty Acids (FISH OIL) 1200 MG CAPS Take 1 capsule by mouth daily.    . potassium chloride SA (K-DUR,KLOR-CON) 20 MEQ tablet Take 20 mEq by mouth daily.  1  . predniSONE (DELTASONE) 5 MG tablet Take 5 mg by mouth daily.  2  . sertraline (ZOLOFT) 100 MG tablet Take 100 mg by mouth daily.  3  . Turmeric 400 MG CAPS Take 2 capsules by mouth daily.    . Azelastine-Fluticasone 137-50 MCG/ACT SUSP Place 1 spray into the nose 2 (two) times daily as needed. 23 g 2   No current facility-administered medications for this visit.    Known medication allergies: Allergies  Allergen Reactions  . Bee Venom     Edema    . Statins   . Sulfa Antibiotics     I appreciate the opportunity to take part in [place] care. Please do not hesitate to contact me with questions.  Sincerely,   R. Jorene Guest, MD

## 2019-07-31 NOTE — Telephone Encounter (Signed)
Noted. Thanks.

## 2019-07-31 NOTE — Telephone Encounter (Signed)
FYI Dr. Bobbitt 

## 2019-07-31 NOTE — Telephone Encounter (Signed)
I have placed a referral to his office. I have also left a voicemail for the patient with this information.   Thanks

## 2019-08-03 ENCOUNTER — Emergency Department (HOSPITAL_BASED_OUTPATIENT_CLINIC_OR_DEPARTMENT_OTHER)
Admission: EM | Admit: 2019-08-03 | Discharge: 2019-08-03 | Disposition: A | Discharge status: Home / Self Care | Payer: MEDICARE | Attending: Emergency Medicine | Admitting: Emergency Medicine

## 2019-08-03 ENCOUNTER — Emergency Department (HOSPITAL_BASED_OUTPATIENT_CLINIC_OR_DEPARTMENT_OTHER): Payer: MEDICARE

## 2019-08-03 ENCOUNTER — Encounter (HOSPITAL_BASED_OUTPATIENT_CLINIC_OR_DEPARTMENT_OTHER): Payer: Self-pay | Admitting: Emergency Medicine

## 2019-08-03 ENCOUNTER — Other Ambulatory Visit: Payer: Self-pay

## 2019-08-03 DIAGNOSIS — R82998 Other abnormal findings in urine: Secondary | ICD-10-CM | POA: Insufficient documentation

## 2019-08-03 DIAGNOSIS — Z7901 Long term (current) use of anticoagulants: Secondary | ICD-10-CM | POA: Diagnosis not present

## 2019-08-03 DIAGNOSIS — Z7982 Long term (current) use of aspirin: Secondary | ICD-10-CM | POA: Diagnosis not present

## 2019-08-03 DIAGNOSIS — I4891 Unspecified atrial fibrillation: Secondary | ICD-10-CM | POA: Diagnosis not present

## 2019-08-03 DIAGNOSIS — Z79899 Other long term (current) drug therapy: Secondary | ICD-10-CM | POA: Insufficient documentation

## 2019-08-03 DIAGNOSIS — I10 Essential (primary) hypertension: Secondary | ICD-10-CM | POA: Diagnosis not present

## 2019-08-03 DIAGNOSIS — R42 Dizziness and giddiness: Secondary | ICD-10-CM | POA: Diagnosis present

## 2019-08-03 LAB — COMPREHENSIVE METABOLIC PANEL
ALT: 29 U/L (ref 0–44)
AST: 29 U/L (ref 15–41)
Albumin: 4.3 g/dL (ref 3.5–5.0)
Alkaline Phosphatase: 56 U/L (ref 38–126)
Anion gap: 11 (ref 5–15)
BUN: 21 mg/dL (ref 8–23)
CO2: 24 mmol/L (ref 22–32)
Calcium: 9.4 mg/dL (ref 8.9–10.3)
Chloride: 107 mmol/L (ref 98–111)
Creatinine, Ser: 0.82 mg/dL (ref 0.44–1.00)
GFR calc Af Amer: >60 mL/min (ref >60)
GFR calc non Af Amer: >60 mL/min (ref >60)
Glucose, Bld: 108 mg/dL — ABNORMAL HIGH (ref 70–99)
Potassium: 3.8 mmol/L (ref 3.5–5.1)
Sodium: 142 mmol/L (ref 135–145)
Total Bilirubin: 0.8 mg/dL (ref 0.3–1.2)
Total Protein: 8 g/dL (ref 6.5–8.1)

## 2019-08-03 LAB — CBC WITH DIFFERENTIAL/PLATELET
Abs Immature Granulocytes: 0.03 10*3/uL (ref 0.00–0.07)
Basophils Absolute: 0.1 10*3/uL (ref 0.0–0.1)
Basophils Relative: 1 %
Eosinophils Absolute: 0.2 10*3/uL (ref 0.0–0.5)
Eosinophils Relative: 2 %
HCT: 43 % (ref 36.0–46.0)
Hemoglobin: 15 g/dL (ref 12.0–15.0)
Immature Granulocytes: 0 %
Lymphocytes Relative: 26 %
Lymphs Abs: 2.1 10*3/uL (ref 0.7–4.0)
MCH: 33.4 pg (ref 26.0–34.0)
MCHC: 34.9 g/dL (ref 30.0–36.0)
MCV: 95.8 fL (ref 80.0–100.0)
Monocytes Absolute: 0.6 10*3/uL (ref 0.1–1.0)
Monocytes Relative: 7 %
Neutro Abs: 5.2 10*3/uL (ref 1.7–7.7)
Neutrophils Relative %: 64 %
Platelets: 162 10*3/uL (ref 150–400)
RBC: 4.49 MIL/uL (ref 3.87–5.11)
RDW: 13.9 % (ref 11.5–15.5)
WBC: 8.1 10*3/uL (ref 4.0–10.5)
nRBC: 0 % (ref 0.0–0.2)

## 2019-08-03 LAB — URINALYSIS, ROUTINE W REFLEX MICROSCOPIC
Bilirubin Urine: NEGATIVE
Glucose, UA: NEGATIVE mg/dL
Hgb urine dipstick: NEGATIVE
Ketones, ur: NEGATIVE mg/dL
Nitrite: NEGATIVE
Protein, ur: NEGATIVE mg/dL
Specific Gravity, Urine: 1.015 (ref 1.005–1.030)
pH: 5.5 (ref 5.0–8.0)

## 2019-08-03 LAB — URINALYSIS, MICROSCOPIC (REFLEX)

## 2019-08-03 NOTE — ED Notes (Signed)
Dr. Rees, ED Provider at bedside. 

## 2019-08-03 NOTE — ED Notes (Signed)
Pt on monitor 

## 2019-08-03 NOTE — ED Triage Notes (Signed)
Patient states that she has had a fullness to her left ear and seen several MD's for the problems. The patient states that today she started to feel " like it wouldn't take much for me to get wobbly" - the patient states that today she felt like she had a vertigo episode. The patient states that she still had noise in her ear but denies any vertigo at this time

## 2019-08-03 NOTE — ED Provider Notes (Signed)
[facility] EMERGENCY DEPARTMENT Provider Note   CSN: [id number] Arrival date & time: 08/03/19  1638     History Chief Complaint  Patient presents with  . Dizziness    Jennifer Hardin is a 79 y.o. female.  The history is provided by the patient and medical records. No language interpreter was used.  Dizziness   Jennifer Hardin is a 79 y.o. female who presents to the Emergency Department complaining of dizziness.  She felt like she was about to get a vertigo attack.  She has a lot of noise in her left ear with difficulty hearing out of the left ear (for many years).   She has three weeks of fullness in her left ear.  She experience head congestion and runny nose prior to the ear fullness beginning.  She experienced similar sxs years ago and required allergy shots.  Today she developed vertigo at about 2pm.  Sxs lasted about one hour.  Sxs were described as mild and worse with movement.  Denies associated N/V, headache, chest pain, numbness, vision changes. She has laser eye surgery 2-3 weeks ago.    She saw her allergy doctor earlier this week.  Takes eliquis for afib.  Takes daily prednisone, 5mg  for several years for body aches and allergies.       Past Medical History:  Diagnosis Date  . Allergic rhinitis   . Anemia   . Anxiety   . Asymptomatic varicose veins   . Atrial fibrillation (HCC)   . Carotid stenosis    very mild (<39% by doppler 10/24/17 at [facility])  . Depression   . Gout   . Granulomatous lung disease (HCC)   . Hearing loss    bilateral  . Hyperglycemia   . Hypersomnia   . Hypertension    benign  . Left atrial enlargement   . Mixed hyperlipidemia   . MR (mitral regurgitation)   . Obesity   . Osteoarthritis, knee    bilateral  . Pulmonary nodules   . Recurrent vertigo   . Rotator cuff arthropathy   . Tendonitis    of elbow or forearm  . Thyroid nodule   . Vitamin D deficiency     Patient Active Problem List   Diagnosis Date Noted  .  Perennial allergic rhinitis with predominant nonallergic component 07/29/2019  . Chronic sinusitis/eustachian tube dysfunction 07/29/2019  . Eustachian tube dysfunction, bilateral 07/29/2019  . Dizziness 07/29/2019  . Low back pain 11/17/2016  . Lymphedema of both lower extremities 07/12/2016  . Primary osteoarthritis of left knee 08/11/2014  . Primary osteoarthritis of right knee 08/11/2014  . Rotator cuff syndrome of left shoulder 08/11/2014    Past Surgical History:  Procedure Laterality Date  . APPENDECTOMY    . MELANOMA EXCISION     ON BACK     OB History   No obstetric history on file.     Family History  Problem Relation Age of Onset  . Hyperlipidemia Sister   . Hypertension Brother     Social History   Tobacco Use  . Smoking status: Never Smoker  . Smokeless tobacco: Never Used  Substance Use Topics  . Alcohol use: Never  . Drug use: Never    Home Medications Prior to Admission medications   Medication Sig Start Date End Date Taking? Authorizing Provider  apixaban (ELIQUIS) 5 MG TABS tablet Take 5 mg by mouth 2 (two) times daily.    [provider]  aspirin EC 81 MG  tablet Take 81 mg by mouth daily.    [provider]  Azelastine-Fluticasone 137-50 MCG/ACT SUSP Place 1 spray into the nose 2 (two) times daily as needed. 07/29/19   Bobbitt, Heywood Iles, MD  fluticasone (FLONASE) 50 MCG/ACT nasal spray Place 2 sprays into both nostrils daily.    [provider]  Magnesium 250 MG TABS Take 1 tablet by mouth daily.    [provider]  montelukast (SINGULAIR) 10 MG tablet Take 10 mg by mouth daily. 10/05/16   [provider]  Multiple Vitamin (MULTIVITAMIN) tablet Take 1 tablet by mouth daily.    [provider]  nebivolol (BYSTOLIC) 5 MG tablet Take 5 mg by mouth daily.    [provider]  olmesartan (BENICAR) 40 MG tablet Take 40 mg by mouth daily. 09/25/16   [provider]  Omega-3 Fatty  Acids (FISH OIL) 1200 MG CAPS Take 1 capsule by mouth daily.    [provider]  potassium chloride SA (K-DUR,KLOR-CON) 20 MEQ tablet Take 20 mEq by mouth daily. 09/25/16   [provider]  predniSONE (DELTASONE) 5 MG tablet Take 5 mg by mouth daily. 11/02/16   [provider]  sertraline (ZOLOFT) 100 MG tablet Take 100 mg by mouth daily. 09/25/16   [provider]  Turmeric 400 MG CAPS Take 2 capsules by mouth daily.    [provider]    Allergies    Bee venom, Statins, and Sulfa antibiotics  Review of Systems   Review of Systems  Neurological: Positive for dizziness.  All other systems reviewed and are negative.   Physical Exam Updated Vital Signs BP (!) 168/68 (BP Location: Left Arm)   Pulse 65   Temp 97.8 F (36.6 C) (Oral)   Resp 19   Wt 102.2 kg   SpO2 100%   BMI 39.91 kg/m   Physical Exam Vitals and nursing note reviewed.  Constitutional:      Appearance: She is well-developed.  HENT:     Head: Normocephalic and atraumatic.     Right Ear: Tympanic membrane normal.     Left Ear: Tympanic membrane normal.  Eyes:     Extraocular Movements: Extraocular movements intact.     Pupils: Pupils are equal, round, and reactive to light.  Cardiovascular:     Rate and Rhythm: Normal rate. Rhythm irregular.     Heart sounds: No murmur.  Pulmonary:     Effort: Pulmonary effort is normal. No respiratory distress.     Breath sounds: Normal breath sounds.  Abdominal:     Palpations: Abdomen is soft.     Tenderness: There is no abdominal tenderness. There is no guarding or rebound.  Musculoskeletal:        General: Swelling present. No tenderness.     Comments: Nonpitting edema to BLE  Skin:    General: Skin is warm and dry.  Neurological:     Mental Status: She is alert and oriented to person, place, and time.     Comments: No asymmetry of facial movements. Visual fields grossly intact.  Very hard of hearing.  5/5 strength in all  four extremities with sensation to light touch intact in all four extremities.    Psychiatric:        Behavior: Behavior normal.     ED Results / Procedures / Treatments   Labs (all labs ordered are listed, but only abnormal results are displayed) Labs Reviewed  COMPREHENSIVE METABOLIC PANEL - Abnormal; Notable for the following  components:      Result Value   Glucose, Bld 108 (*)    All other components within normal limits  URINALYSIS, ROUTINE W REFLEX MICROSCOPIC - Abnormal; Notable for the following components:   APPearance HAZY (*)    Leukocytes,Ua MODERATE (*)    All other components within normal limits  URINALYSIS, MICROSCOPIC (REFLEX) - Abnormal; Notable for the following components:   Bacteria, UA FEW (*)    All other components within normal limits  URINE CULTURE  CBC WITH DIFFERENTIAL/PLATELET    EKG None  Radiology CT Head Wo Contrast  Result Date: 08/03/2019 CLINICAL DATA:  Vertigo. EXAM: CT HEAD WITHOUT CONTRAST TECHNIQUE: Contiguous axial images were obtained from the base of the skull through the vertex without intravenous contrast. COMPARISON:  No recent comparison. FINDINGS: Brain: No evidence of acute infarction, hemorrhage, hydrocephalus, extra-axial collection or mass lesion/mass effect. Vascular: No hyperdense vessel or unexpected calcification. Skull: Normal. Negative for fracture or focal lesion. Hyperostosis frontalis interna with calvarial thickening of bilateral frontal calvarium. Sinuses/Orbits: Visualized paranasal sinuses are clear. No mastoid effusion. Orbits are unremarkable. Other: None. IMPRESSION: No acute intracranial abnormality. Electronically Signed   By: Zetta Bills M.D.   On: 08/03/2019 18:50    Procedures Procedures (including critical care time)  Medications Ordered in ED Medications - No data to display  ED Course  I have reviewed the triage vital signs and the nursing notes.  Pertinent labs & imaging results that were  available during my care of the patient were reviewed by me and considered in my medical decision making (see chart for details).    MDM Rules/Calculators/A&P                     Patient with history of atrial fibrillation on anticoagulation here for evaluation of brief vertigo episode that lasted about one hour. By history this appears to be positional in nature. She is asymptomatic on evaluation in the department. She is hard of hearing, which is at her baseline per patient. Presentation is not consistent with subarachnoid hemorrhage, CVA, hypertensive urgency. Discussed with patient unclear source of symptoms. Discussed continued follow-up with ENT, may require neurology for referral if she has ongoing issues. Return precautions discussed. UA is not consistent with UTI in the setting of her current symptoms, will only treat if culture is positive.  Final Clinical Impression(s) / ED Diagnoses Final diagnoses:  Dizziness    Rx / DC Orders ED Discharge Orders    None       Quintella Reichert, MD 08/03/19 2324

## 2019-08-05 LAB — URINE CULTURE

## 2019-08-29 ENCOUNTER — Ambulatory Visit: Payer: Self-pay | Admitting: Allergy and Immunology

## 2019-12-24 ENCOUNTER — Ambulatory Visit (INDEPENDENT_AMBULATORY_CARE_PROVIDER_SITE_OTHER): Payer: MEDICARE | Admitting: Family Medicine

## 2019-12-24 ENCOUNTER — Other Ambulatory Visit: Payer: Self-pay

## 2019-12-24 ENCOUNTER — Encounter: Payer: Self-pay | Admitting: Family Medicine

## 2019-12-24 VITALS — BP 134/82 | HR 83 | Ht 67.0 in | Wt 220.0 lb

## 2019-12-24 DIAGNOSIS — M170 Bilateral primary osteoarthritis of knee: Secondary | ICD-10-CM | POA: Diagnosis present

## 2019-12-24 NOTE — Progress Notes (Signed)
ARRAYA BUCK - 79 y.o. female MRN [id number]  Date of birth: 20-Jun-1940  SUBJECTIVE:  Including CC & ROS.  Chief Complaint  Patient presents with  . Knee Pain    left    [place] HUBERTY is a 79 y.o. female that is presenting with acute on chronic knee pain.  Has a history of receiving injections.  They had limited improvement in the past.  No history of recent injury or fall.  She does use a cane on a regular basis.   Review of Systems See HPI   HISTORY: Past Medical, Surgical, Social, and Family History Reviewed & Updated per EMR.   Pertinent Historical Findings include:  Past Medical History:  Diagnosis Date  . Allergic rhinitis   . Anemia   . Anxiety   . Asymptomatic varicose veins   . Atrial fibrillation (HCC)   . Carotid stenosis    very mild (<39% by doppler 10/24/17 at [facility] [address])  . Depression   . Gout   . Granulomatous lung disease (HCC)   . Hearing loss    bilateral  . Hyperglycemia   . Hypersomnia   . Hypertension    benign  . Left atrial enlargement   . Mixed hyperlipidemia   . MR (mitral regurgitation)   . Obesity   . Osteoarthritis, knee    bilateral  . Pulmonary nodules   . Recurrent vertigo   . Rotator cuff arthropathy   . Tendonitis    of elbow or forearm  . Thyroid nodule   . Vitamin D deficiency     Past Surgical History:  Procedure Laterality Date  . APPENDECTOMY    . MELANOMA EXCISION     ON BACK    Family History  Problem Relation Age of Onset  . Hyperlipidemia Sister   . Hypertension Brother     Social History   Socioeconomic History  . Marital status: Divorced    Spouse name: Not on file  . Number of children: Not on file  . Years of education: Not on file  . Highest education level: Not on file  Occupational History  . Not on file  Tobacco Use  . Smoking status: Never Smoker  . Smokeless tobacco: Never Used  Vaping Use  . Vaping Use: Never used  Substance and Sexual Activity  . Alcohol use: Never  . Drug use: Never    . Sexual activity: Not on file  Other Topics Concern  . 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 Difficulty of Paying Living Expenses: Not on file  Food Insecurity:   . Worried About Programme researcher, broadcasting/film/video in the Last Year: Not on file  . Ran Out of Food in the Last Year: Not on file  Transportation Needs:   . Lack of Transportation (Medical): Not on file  . Lack of Transportation (Non-Medical): Not on file  Physical Activity:   . Days of Exercise per Week: Not on file  . Minutes of Exercise per Session: Not on file  Stress:   . Feeling of Stress : Not on file  Social Connections:   . Frequency of Communication with Friends and Family: Not on file  . Frequency of Social Gatherings with Friends and Family: Not on file  . Attends Religious Services: Not on file  . Active Member of Clubs or Organizations: Not on file  .  Attends Banker Meetings: Not on file  . Marital Status: Not on file  Intimate Partner Violence:   . Fear of Current or Ex-Partner: Not on file  . Emotionally Abused: Not on file  . Physically Abused: Not on file  . Sexually Abused: Not on file     PHYSICAL EXAM:  VS: BP 134/82   Pulse 83   Ht 5\' 7"  (1.702 m)   Wt 220 lb (99.8 kg)   BMI 34.46 kg/m  Physical Exam Gen: NAD, alert, cooperative with exam, well-appearing MSK:  Right and left knee: No obvious effusion. Normal range of motion. No instability. Neurovascularly intact     ASSESSMENT & PLAN:   OA (osteoarthritis) of knee Acute on chronic in nature.  Has some balance issues.  Strengthening to help her knee pain as well. -Counseled on home exercise therapy and supportive care. -Referral to physical therapy. -Provided samples of pennsaid -Can consider updated imaging and/or injections.

## 2019-12-24 NOTE — Progress Notes (Signed)
Medication Samples have been provided to the patient.  Drug name: Pennsaid       Strength: 2%        Qty: 2 boxes  LOT: [id number]  Exp.Date: 05/2020  Dosing instructions: Use a pea size amount and rub gently.   The patient has been instructed regarding the correct time, dose, and frequency of taking this medication, including desired effects and most common side effects.   Kathi Simpers, MA 3:26 PM 12/24/2019

## 2019-12-24 NOTE — Patient Instructions (Signed)
Nice to meet you  Please try the pennsaid  Please try ice  Physical therapy will give you a call  Please send me a message in MyChart with any questions or updates.  Please see me back in 4 weeks.   --Dr. Jordan Likes

## 2019-12-25 ENCOUNTER — Encounter: Payer: Self-pay | Admitting: Family Medicine

## 2019-12-25 NOTE — Assessment & Plan Note (Addendum)
Acute on chronic in nature.  Has some balance issues.  Strengthening to help her knee pain as well. -Counseled on home exercise therapy and supportive care. -Referral to physical therapy. -Provided samples of pennsaid -Can consider updated imaging and/or injections.

## 2020-01-15 ENCOUNTER — Ambulatory Visit: Payer: MEDICARE | Attending: Family Medicine | Admitting: Physical Therapy

## 2020-01-15 ENCOUNTER — Encounter: Payer: Self-pay | Admitting: Physical Therapy

## 2020-01-15 ENCOUNTER — Other Ambulatory Visit: Payer: Self-pay

## 2020-01-15 VITALS — BP 115/70 | HR 62

## 2020-01-15 DIAGNOSIS — M25662 Stiffness of left knee, not elsewhere classified: Secondary | ICD-10-CM | POA: Diagnosis present

## 2020-01-15 DIAGNOSIS — M6281 Muscle weakness (generalized): Secondary | ICD-10-CM | POA: Insufficient documentation

## 2020-01-15 DIAGNOSIS — M25562 Pain in left knee: Secondary | ICD-10-CM | POA: Insufficient documentation

## 2020-01-15 DIAGNOSIS — G8929 Other chronic pain: Secondary | ICD-10-CM | POA: Insufficient documentation

## 2020-01-15 DIAGNOSIS — R262 Difficulty in walking, not elsewhere classified: Secondary | ICD-10-CM | POA: Diagnosis present

## 2020-01-15 NOTE — Therapy (Signed)
[facility] Outpatient Rehabilitation [facility] [address]  [address] [place], [place], [address] Phone: [phone]   Fax:  [phone]  Physical Therapy Evaluation  Patient Details  Name: Jennifer Hardin MRN: [id number] Date of Birth: 02-Sep-1940 Referring Provider (PT): Clare Gandy, MD   Encounter Date: 01/15/2020   PT End of Session - 01/15/20 1022    Visit Number 1    Number of Visits 13    Date for PT Re-Evaluation 02/26/20    Authorization Type Medicare & Federal BCBS    PT Start Time [phone]    PT Stop Time 1012    PT Time Calculation (min) 35 min    Activity Tolerance Patient tolerated treatment well;Patient limited by pain    Behavior During Therapy [facility] for tasks assessed/performed           Past Medical History:  Diagnosis Date  . Allergic rhinitis   . Anemia   . Anxiety   . Asymptomatic varicose veins   . Atrial fibrillation (HCC)   . Carotid stenosis    very mild (<39% by doppler 10/24/17 at [facility])  . Depression   . Gout   . Granulomatous lung disease (HCC)   . Hearing loss    bilateral  . Hyperglycemia   . Hypersomnia   . Hypertension    benign  . Left atrial enlargement   . Mixed hyperlipidemia   . MR (mitral regurgitation)   . Obesity   . Osteoarthritis, knee    bilateral  . Pulmonary nodules   . Recurrent vertigo   . Rotator cuff arthropathy   . Tendonitis    of elbow or forearm  . Thyroid nodule   . Vitamin D deficiency     Past Surgical History:  Procedure Laterality Date  . APPENDECTOMY    . MELANOMA EXCISION     ON BACK    Vitals:   01/15/20 0951  BP: 115/70  Pulse: 62  SpO2: 95%      Subjective Assessment - 01/15/20 0939    Subjective Patient reports chronic L knee pain of 10 years duration with worsening over time. Pain occurs over the anterior knee with radiation down the lateral shin. Noting intermittent N/T with prolonged standing, but denies recent exacerbation of LBP or B&B changes. Pain is  worse with walking, getting out of a chair, sleeping on the L side. Better with pain relief cream.    Pertinent History RTC arthropathy, recurrent vertigo, B knee OA, HLD, HTN, hyperglycemia, B hearing loss, gout, depression, a-fib, anxiety, anemia    Limitations Sitting;Standing;Walking;House hold activities    Diagnostic tests none recent    Patient Stated Goals decrease pain    Currently in Pain? No/denies    Pain Score 0-No pain    Pain Location Knee    Pain Orientation Left;Anterior    Pain Descriptors / Indicators Aching    Pain Type Chronic pain    Pain Radiating Towards down lateral shin              [facility] PT Assessment - 01/15/20 0945      Assessment   Medical Diagnosis Primary OA of B knees    Referring Provider (PT) Clare Gandy, MD    Onset Date/Surgical Date --   10 years   Prior Therapy yes- several years ago      Precautions   Precautions --   B hearing loss     Balance Screen   Has the patient fallen  in the past 6 months No    Has the patient had a decrease in activity level because of a fear of falling?  Yes    Is the patient reluctant to leave their home because of a fear of falling?  Yes   sometimes     Home Nurse, mental health Private residence    Living Arrangements Alone    Available Help at Discharge Family;Friend(s)    Type of Home House    Home Access Stairs to enter    Entrance Stairs-Number of Steps 1    Entrance Stairs-Rails --   able to hold onto posts   Home Layout One level    Home Equipment [place] - single point;Walker - 4 wheels      Prior Function   Level of Independence Independent    Vocation Part time employment    Research scientist (physical sciences)- standing, sitting    Leisure working in the yard      Cognition   Overall Cognitive Status Within Functional Limits for tasks assessed      Observation/Other Assessments   Observations appearing SOB after ambulating into assessment room      Sensation    Light Touch Appears Intact      Coordination   Gross Motor Movements are Fluid and Coordinated Yes      Posture/Postural Control   Posture/Postural Control Postural limitations    Postural Limitations Rounded Shoulders;Forward head;Increased thoracic kyphosis      ROM / Strength   AROM / PROM / Strength AROM;PROM;Strength      AROM   AROM Assessment Site Knee    Right/Left Knee Right;Left    Right Knee Extension 0    Right Knee Flexion 125    Left Knee Extension 5    Left Knee Flexion 120      PROM   PROM Assessment Site Knee    Right/Left Knee Right;Left    Right Knee Extension 5    Right Knee Flexion 125    Left Knee Extension 0    Left Knee Flexion 130      Strength   Strength Assessment Site Hip;Knee;Ankle    Right/Left Hip Right;Left    Right Hip Flexion 4/5    Right Hip ABduction 4/5    Right Hip ADduction 4+/5    Left Hip Flexion 4+/5    Left Hip ABduction 4/5    Left Hip ADduction 4+/5    Right/Left Knee Right;Left    Right Knee Flexion 4+/5    Right Knee Extension 4/5    Left Knee Flexion 4+/5   pain   Left Knee Extension 4/5   pain   Right/Left Ankle Right;Left    Right Ankle Dorsiflexion 4+/5    Right Ankle Plantar Flexion 4/5    Left Ankle Dorsiflexion 4+/5    Left Ankle Plantar Flexion 4/5      Palpation   Patella mobility normal mobility in all directions on R, moderately hypomobile on L    Palpation comment no TTP over L knee or calf      Ambulation/Gait   Assistive device Straight cane    Gait Pattern Step-through pattern;Step-to pattern;Decreased step length - right;Decreased step length - left;Trunk flexed;Lateral hip instability    Ambulation Surface Level;Indoor    Gait velocity decreased                      Objective measurements completed on examination: See above findings.  PT Education - 01/15/20 1021    Education Details prognosis, POC, HEP    Person(s) Educated Patient    Methods  Explanation;Demonstration;Tactile cues;Verbal cues;Handout    Comprehension Verbalized understanding            PT Short Term Goals - 01/15/20 1034      PT SHORT TERM GOAL #1   Title Patient to be independent with initial HEP.    Time 2    Period Weeks    Status New    Target Date 01/29/20             PT Long Term Goals - 01/15/20 1036      PT LONG TERM GOAL #1   Title Patient to be independent with advanced HEP.    Time 6    Period Weeks    Status New    Target Date 02/26/20      PT LONG TERM GOAL #2   Title Patient to demonstrate L knee AROM symmetrical to opposite LE and without pain limiting.    Time 6    Period Weeks    Status New    Target Date 02/26/20      PT LONG TERM GOAL #3   Title Patient to demonstrate B LE strength >/=4+/5.    Time 6    Period Weeks    Status New    Target Date 02/26/20      PT LONG TERM GOAL #4   Title Patient to demonstrate 5xSTS in <15 seconds in order to decrease fall risk.    Time 6    Period Weeks    Status New    Target Date 02/26/20      PT LONG TERM GOAL #5   Title Patient to report tolerance for L sidelying d/t improved pain.    Time 6    Period Weeks    Status New    Target Date 02/26/20                  Plan - 01/15/20 1022    Clinical Impression Statement Patient is an 78y/o F presenting to OPPT with c/o chronic L knee pain of 10 years duration. Pain occurs over the anterior knee with radiation down the lateral shin. Noting intermittent N/T with prolonged standing, but denies recent exacerbation of LBP or B&B changes. Pain is worse with walking, getting out of a chair, sleeping on the L side. Patient today presenting with rounded and kyphotic posture, decreased L knee ROM, decreased B LE strength, L patellar hypomobility, and gait deviations. Assessment somewhat limited d/t communication issues, as patient's hearing aids were not working. Patient was educated on gentle stretching and strengthening HEP-  patient reported understanding. Would benefit from skilled PT services 2x/week for 6 weeks to address aforementioned impairments.    Personal Factors and Comorbidities Age;Comorbidity 3+;Profession;Past/Current Experience;Time since onset of injury/illness/exacerbation    Comorbidities RTC arthropathy, recurrent vertigo, B knee OA, HLD, HTN, hyperglycemia, B hearing loss, gout, depression, a-fib, anxiety, anemia    Examination-Activity Limitations Bend;Squat;Caring for Others;Stairs;Stand;Carry;Transfers;Dressing;Hygiene/Grooming;Lift;Locomotion Level    Examination-Participation Restrictions Cleaning;Community Activity;Church;Driving;Interpersonal Relationship;Laundry;Occupation;Meal Prep;Yard Work;Shop    Stability/Clinical Decision Making Stable/Uncomplicated    Clinical Decision Making Low    Rehab Potential Good    PT Frequency 2x / week    PT Duration 6 weeks    PT Treatment/Interventions ADLs/Self Care Home Management;Cryotherapy;Electrical Stimulation;Iontophoresis 4mg /ml Dexamethasone;Moist Heat;Balance training;Therapeutic exercise;Therapeutic activities;Functional mobility training;Stair training;Gait training;DME Instruction;Ultrasound;Neuromuscular re-education;Patient/family education;Manual techniques;Vasopneumatic Device;Taping;Energy conservation;Dry needling;Passive range of  motion    PT Next Visit Plan reassess HEP, progress LE stretching and strengthening    Consulted and Agree with Plan of Care Patient           Patient will benefit from skilled therapeutic intervention in order to improve the following deficits and impairments:  Abnormal gait, Decreased endurance, Hypomobility, Increased edema, Decreased activity tolerance, Decreased strength, Pain, Increased fascial restricitons, Increased muscle spasms, Difficulty walking, Decreased balance, Improper body mechanics, Decreased range of motion, Postural dysfunction, Impaired flexibility  Visit Diagnosis: Chronic pain of  left knee  Stiffness of left knee, not elsewhere classified  Muscle weakness (generalized)  Difficulty in walking, not elsewhere classified     Problem List Patient Active Problem List   Diagnosis Date Noted  . Perennial allergic rhinitis with predominant nonallergic component 07/29/2019  . Chronic sinusitis/eustachian tube dysfunction 07/29/2019  . Eustachian tube dysfunction, bilateral 07/29/2019  . Dizziness 07/29/2019  . Low back pain 11/17/2016  . Lymphedema of both lower extremities 07/12/2016  . OA (osteoarthritis) of knee 08/11/2014  . Rotator cuff syndrome of left shoulder 08/11/2014      Anette Guarneri, PT, DPT 01/15/20 10:40 AM   [facility] [address]  [address] [place], [place], [address] Phone: [phone]   Fax:  [phone]  Name: Jennifer Hardin MRN: [id number] Date of Birth: January 12, 1941

## 2020-01-21 ENCOUNTER — Ambulatory Visit (INDEPENDENT_AMBULATORY_CARE_PROVIDER_SITE_OTHER): Payer: MEDICARE | Admitting: Family Medicine

## 2020-01-21 ENCOUNTER — Encounter: Payer: Self-pay | Admitting: Physical Therapy

## 2020-01-21 ENCOUNTER — Encounter: Payer: Self-pay | Admitting: Family Medicine

## 2020-01-21 ENCOUNTER — Other Ambulatory Visit: Payer: Self-pay

## 2020-01-21 ENCOUNTER — Ambulatory Visit: Payer: MEDICARE | Attending: Family Medicine | Admitting: Physical Therapy

## 2020-01-21 VITALS — BP 105/70 | HR 64

## 2020-01-21 DIAGNOSIS — G8929 Other chronic pain: Secondary | ICD-10-CM | POA: Diagnosis present

## 2020-01-21 DIAGNOSIS — M6281 Muscle weakness (generalized): Secondary | ICD-10-CM | POA: Insufficient documentation

## 2020-01-21 DIAGNOSIS — R262 Difficulty in walking, not elsewhere classified: Secondary | ICD-10-CM | POA: Diagnosis present

## 2020-01-21 DIAGNOSIS — M170 Bilateral primary osteoarthritis of knee: Secondary | ICD-10-CM

## 2020-01-21 DIAGNOSIS — M25562 Pain in left knee: Secondary | ICD-10-CM | POA: Diagnosis not present

## 2020-01-21 DIAGNOSIS — M25662 Stiffness of left knee, not elsewhere classified: Secondary | ICD-10-CM | POA: Diagnosis present

## 2020-01-21 NOTE — Assessment & Plan Note (Signed)
Pain is staying about the same.  Seems to be worse the longer she has to walk or be on her feet. -Counseled on home exercise therapy and supportive care. -Provided temporary parking placard. -Continue physical therapy. -Could consider injection.

## 2020-01-21 NOTE — Therapy (Signed)
[facility] Outpatient Rehabilitation [facility] [address]  [address] [place], [place], [address] Phone: [phone]   Fax:  [phone]  Physical Therapy Treatment  Patient Details  Name: Jennifer Hardin MRN: [id number] Date of Birth: 02/26/1941 Referring Provider (PT): Clare Gandy, MD   Encounter Date: 01/21/2020   PT End of Session - 01/21/20 1354    Visit Number 2    Number of Visits 13    Date for PT Re-Evaluation 02/26/20    Authorization Type Medicare & Federal BCBS    PT Start Time 1308    PT Stop Time 1352    PT Time Calculation (min) 44 min    Activity Tolerance Patient tolerated treatment well;Patient limited by pain    Behavior During Therapy [facility] for tasks assessed/performed           Past Medical History:  Diagnosis Date  . Allergic rhinitis   . Anemia   . Anxiety   . Asymptomatic varicose veins   . Atrial fibrillation (HCC)   . Carotid stenosis    very mild (<39% by doppler 10/24/17 at [facility])  . Depression   . Gout   . Granulomatous lung disease (HCC)   . Hearing loss    bilateral  . Hyperglycemia   . Hypersomnia   . Hypertension    benign  . Left atrial enlargement   . Mixed hyperlipidemia   . MR (mitral regurgitation)   . Obesity   . Osteoarthritis, knee    bilateral  . Pulmonary nodules   . Recurrent vertigo   . Rotator cuff arthropathy   . Tendonitis    of elbow or forearm  . Thyroid nodule   . Vitamin D deficiency     Past Surgical History:  Procedure Laterality Date  . APPENDECTOMY    . MELANOMA EXCISION     ON BACK    Vitals:   01/21/20 1309  BP: 105/70  Pulse: 64  SpO2: 97%     Subjective Assessment - 01/21/20 1305    Subjective Feels out of breath from walking in here- this has happened to her for several years now. Had some trouble with one of her exercises from HEP.    Pertinent History RTC arthropathy, recurrent vertigo, B knee OA, HLD, HTN, hyperglycemia, B hearing loss, gout, depression,  a-fib, anxiety, anemia    Diagnostic tests none recent    Patient Stated Goals decrease pain    Currently in Pain? Yes    Pain Score 6     Pain Location Knee    Pain Orientation Left    Pain Descriptors / Indicators Aching    Pain Type Chronic pain                             [facility] Adult PT Treatment/Exercise - 01/21/20 0001      Exercises   Exercises Knee/Hip      Knee/Hip Exercises: Aerobic   Nustep L1 x 6 min (UEs/LEs)      Knee/Hip Exercises: Standing   Heel Raises Both;10 reps;2 sets    Heel Raises Limitations at counter; 2nd set B concentric, mostly L eccentric      Knee/Hip Exercises: Seated   Long Arc Quad Strengthening;Left;2 sets;10 reps    Long Arc Quad [employer] --   0,2   Long Arc Quad Limitations 10x 0#, 10x 2#      Knee/Hip Exercises: Supine   [employer]  Strengthening;Left;1 set;10 reps    Quad Sets Limitations 10x5" with towel roll under knee   good quad contraction   Heel Slides AAROM;Left;1 set;10 reps    Heel Slides Limitations with strap assist   cues to avoid pushing into pain     Knee/Hip Exercises: Sidelying   Clams 2x10 each LE   minimal posterior rotation of hips     Manual Therapy   Manual Therapy Taping    Kinesiotex Create Space      Kinesiotix   Create Space L knee chondromalacia patellae pattern with 50% stretch on 2 circumferential strips and 80% stretch on 2 superior/inferiro strips                   PT Education - 01/21/20 1354    Education Details edu on KT tape wear time, removal, precautions    Person(s) Educated Patient    Methods Explanation;Demonstration;Tactile cues;Verbal cues;Handout    Comprehension Verbalized understanding            PT Short Term Goals - 01/21/20 1355      PT SHORT TERM GOAL #1   Title Patient to be independent with initial HEP.    Time 2    Period Weeks    Status On-going    Target Date 01/29/20             PT Long Term Goals - 01/21/20 1355      PT LONG TERM  GOAL #1   Title Patient to be independent with advanced HEP.    Time 6    Period Weeks    Status On-going      PT LONG TERM GOAL #2   Title Patient to demonstrate L knee AROM symmetrical to opposite LE and without pain limiting.    Time 6    Period Weeks    Status On-going      PT LONG TERM GOAL #3   Title Patient to demonstrate B LE strength >/=4+/5.    Time 6    Period Weeks    Status On-going      PT LONG TERM GOAL #4   Title Patient to demonstrate 5xSTS in <15 seconds in order to decrease fall risk.    Time 6    Period Weeks    Status On-going      PT LONG TERM GOAL #5   Title Patient to report tolerance for L sidelying d/t improved pain.    Time 6    Period Weeks    Status On-going                 Plan - 01/21/20 1354    Clinical Impression Statement Patient arrived to session with some SOB, however noting that this is present for her at baseline as she has had SOB with exertion for several years. Vitals stable at start of session, thus proceeded with rest of appointment. Patient noted trouble with one of her exercises from HEP, thus this was reviewed. Patient able to tolerate increase in challenge with both LAQ and heel raises today. Noted pain with heel slides, thus was instructed on moving through comfortable ROM only, which improved patient's tolerance. Proceeded to initiate sidelying hip strengthening with fair form. Ended session with KT tape application to L knee for pain relief- patient reported good understanding of KT tape wear time, removal, and precautions. No complaints at end of session. Plan to continue progressing LE strengthening and knee ROM ther-ex to patient's tolerance, with occasional  review of HEP to maintain good carryover, as patient's HOH is a challenge to communication within session.    Comorbidities RTC arthropathy, recurrent vertigo, B knee OA, HLD, HTN, hyperglycemia, B hearing loss, gout, depression, a-fib, anxiety, anemia    PT  Treatment/Interventions ADLs/Self Care Home Management;Cryotherapy;Electrical Stimulation;Iontophoresis 4mg /ml Dexamethasone;Moist Heat;Balance training;Therapeutic exercise;Therapeutic activities;Functional mobility training;Stair training;Gait training;DME Instruction;Ultrasound;Neuromuscular re-education;Patient/family education;Manual techniques;Vasopneumatic Device;Taping;Energy conservation;Dry needling;Passive range of motion    PT Next Visit Plan assess 5xSTS, progress LE stretching and strengthening    Consulted and Agree with Plan of Care Patient           Patient will benefit from skilled therapeutic intervention in order to improve the following deficits and impairments:  Abnormal gait, Decreased endurance, Hypomobility, Increased edema, Decreased activity tolerance, Decreased strength, Pain, Increased fascial restricitons, Increased muscle spasms, Difficulty walking, Decreased balance, Improper body mechanics, Decreased range of motion, Postural dysfunction, Impaired flexibility  Visit Diagnosis: Chronic pain of left knee  Stiffness of left knee, not elsewhere classified  Muscle weakness (generalized)  Difficulty in walking, not elsewhere classified     Problem List Patient Active Problem List   Diagnosis Date Noted  . Perennial allergic rhinitis with predominant nonallergic component 07/29/2019  . Chronic sinusitis/eustachian tube dysfunction 07/29/2019  . Eustachian tube dysfunction, bilateral 07/29/2019  . Dizziness 07/29/2019  . Low back pain 11/17/2016  . Lymphedema of both lower extremities 07/12/2016  . OA (osteoarthritis) of knee 08/11/2014  . Rotator cuff syndrome of left shoulder 08/11/2014     08/13/2014, PT, DPT 01/21/20 1:56 PM   [facility] Outpatient Rehabilitation [facility] [address]  [address] [place], [place], [place] Phone: [phone]   Fax:  [phone]  Name: Jennifer Hardin MRN: [id number] Cater Date of Birth:  Feb 13, 1941

## 2020-01-21 NOTE — Patient Instructions (Signed)
Good to see you [birth year] Birth[birth year]  Please continue physical therapy   Please send me a message in MyChart with any questions or updates.  Please see me back in 4 weeks.   --Dr. Jordan Likes

## 2020-01-21 NOTE — Patient Instructions (Signed)
   Kinesiology tape  What is kinesiology tape?  There are many brands of kinesiology tape. KTape, Rock Tape, Body Sport, Dynamic tape, to name a few.  It is an elasticized tape designed to support the body's natural healing process. This tape provides stability and support to muscles and joints without restricting motion.  It can also help decrease swelling in the area of application.  How does it work?  The tape microscopically lifts and decompresses the skin to allow for drainage of lymph (swelling) to flow away from area, reducing inflammation. The tape has the ability to help re-educate the neuromuscular system by targeting specific receptors in the skin. The presence of the tape increases the body's awareness of posture and body mechanics.  Do not use with:  . Open wounds . Skin lesions . Adhesive allergies  In some rare cases, mild/moderate skin irritation can occur. This can include redness, itchiness, or hives. If this occurs, immediately remove tape and consult your primary care physician if symptoms are severe or do not resolve within 2 days.  Safe removal of the tape:  To remove tape safely, hold nearby skin with one hand and gentle roll tape down with other hand. You can apply oil or conditioner to tape while in shower prior to removal to loosen adhesive. DO NOT swiftly rip tape off like a band-aid, as this could cause skin tears and additional skin irritation.     For questions, please contact your therapist at:  [facility] [place] [address]  [address] [place], [place], [address] Phone: [phone]   Fax:  [phone]     

## 2020-01-21 NOTE — Progress Notes (Signed)
Jennifer Hardin - 79 y.o. female MRN [id number]  Date of birth: [birth year]  SUBJECTIVE:  Including CC & ROS.  Chief Complaint  Patient presents with  . Follow-up    bilateral knee    Jennifer Hardin is a 79 y.o. female that is following up for left knee pain.  The pain is staying about the same.  It seems to be worse when she is walking for any prolonged period of time.   Review of Systems See HPI   HISTORY: Past Medical, Surgical, Social, and Family History Reviewed & Updated per EMR.   Pertinent Historical Findings include:  Past Medical History:  Diagnosis Date  . Allergic rhinitis   . Anemia   . Anxiety   . Asymptomatic varicose veins   . Atrial fibrillation (HCC)   . Carotid stenosis    very mild (<39% by doppler 10/24/17 at [facility])  . Depression   . Gout   . Granulomatous lung disease (HCC)   . Hearing loss    bilateral  . Hyperglycemia   . Hypersomnia   . Hypertension    benign  . Left atrial enlargement   . Mixed hyperlipidemia   . MR (mitral regurgitation)   . Obesity   . Osteoarthritis, knee    bilateral  . Pulmonary nodules   . Recurrent vertigo   . Rotator cuff arthropathy   . Tendonitis    of elbow or forearm  . Thyroid nodule   . Vitamin D deficiency     Past Surgical History:  Procedure Laterality Date  . APPENDECTOMY    . MELANOMA EXCISION     ON BACK    Family History  Problem Relation Age of Onset  . Hyperlipidemia Sister   . Hypertension Brother     Social History   Socioeconomic History  . Marital status: Divorced    Spouse name: Not on file  . Number of children: Not on file  . Years of education: Not on file  . Highest education level: Not on file  Occupational History  . Not on file  Tobacco Use  . Smoking status: Never Smoker  . Smokeless tobacco: Never Used  Vaping Use  . Vaping Use: Never used  Substance and Sexual Activity  . Alcohol use: Never  . Drug use: Never  . Sexual activity: Not on file  Other Topics  Concern  . Not on file  Social History Narrative   L[facility] in [facility]   Substitute teacher   Social Determinants of Health   Financial Resource Strain:   . Difficulty of Paying Living Expenses: Not on file  Food Insecurity:   . Worried About Programme researcher, broadcasting/film/video in the Last Year: Not on file  . Ran Out of Food in the Last Year: Not on file  Transportation Needs:   . Lack of Transportation (Medical): Not on file  . Lack of Transportation (Non-Medical): Not on file  Physical Activity:   . Days of Exercise per Week: Not on file  . Minutes of Exercise per Session: Not on file  Stress:   . Feeling of Stress : Not on file  Social Connections:   . Frequency of Communication with Friends and Family: Not on file  . Frequency of Social Gatherings with Friends and Family: Not on file  . Attends Religious Services: Not on file  . Active Member of Clubs or Organizations: Not on file  . Attends Banker Meetings: Not on file  .  Marital Status: Not on file  Intimate Partner Violence:   . Fear of Current or Ex-Partner: Not on file  . Emotionally Abused: Not on file  . Physically Abused: Not on file  . Sexually Abused: Not on file     PHYSICAL EXAM:  VS: BP (!) 142/83   Pulse 85   Ht 5\' 8"  (1.727 m)   Wt 220 lb (99.8 kg)   BMI 33.45 kg/m  Physical Exam Gen: NAD, alert, cooperative with exam, well-appearing     ASSESSMENT & PLAN:   OA (osteoarthritis) of knee Pain is staying about the same.  Seems to be worse the longer she has to walk or be on her feet. -Counseled on home exercise therapy and supportive care. -Provided temporary parking placard. -Continue physical therapy. -Could consider injection.

## 2020-01-23 ENCOUNTER — Ambulatory Visit: Payer: MEDICARE | Admitting: Physical Therapy

## 2020-01-23 ENCOUNTER — Encounter: Payer: Self-pay | Admitting: Physical Therapy

## 2020-01-23 ENCOUNTER — Other Ambulatory Visit: Payer: Self-pay

## 2020-01-23 DIAGNOSIS — M25562 Pain in left knee: Secondary | ICD-10-CM

## 2020-01-23 DIAGNOSIS — M6281 Muscle weakness (generalized): Secondary | ICD-10-CM

## 2020-01-23 DIAGNOSIS — G8929 Other chronic pain: Secondary | ICD-10-CM

## 2020-01-23 DIAGNOSIS — M25662 Stiffness of left knee, not elsewhere classified: Secondary | ICD-10-CM

## 2020-01-23 DIAGNOSIS — R262 Difficulty in walking, not elsewhere classified: Secondary | ICD-10-CM

## 2020-01-23 NOTE — Therapy (Signed)
[facility] [address]  [address] [place], [place], [address] Phone: [phone]   Fax:  [phone]  Physical Therapy Treatment  Patient Details  Name: Jennifer Hardin MRN: [id number] Date of Birth: 09-01-1940 Referring Provider (PT): Clare Gandy, MD   Encounter Date: 01/23/2020   PT End of Session - 01/23/20 1619    Visit Number 3    Number of Visits 13    Date for PT Re-Evaluation 02/26/20    Authorization Type Medicare & Federal BCBS    PT Start Time 1531    PT Stop Time 1616    PT Time Calculation (min) 45 min    Activity Tolerance Patient tolerated treatment well    Behavior During Therapy [facility] for tasks assessed/performed           Past Medical History:  Diagnosis Date  . Allergic rhinitis   . Anemia   . Anxiety   . Asymptomatic varicose veins   . Atrial fibrillation (HCC)   . Carotid stenosis    very mild (<39% by doppler 10/24/17 at [facility])  . Depression   . Gout   . Granulomatous lung disease (HCC)   . Hearing loss    bilateral  . Hyperglycemia   . Hypersomnia   . Hypertension    benign  . Left atrial enlargement   . Mixed hyperlipidemia   . MR (mitral regurgitation)   . Obesity   . Osteoarthritis, knee    bilateral  . Pulmonary nodules   . Recurrent vertigo   . Rotator cuff arthropathy   . Tendonitis    of elbow or forearm  . Thyroid nodule   . Vitamin D deficiency     Past Surgical History:  Procedure Laterality Date  . APPENDECTOMY    . MELANOMA EXCISION     ON BACK    There were no vitals filed for this visit.   Subjective Assessment - 01/23/20 1533    Subjective Had a lot of pain yesterday. Feels like the tape did help.    Pertinent History RTC arthropathy, recurrent vertigo, B knee OA, HLD, HTN, hyperglycemia, B hearing loss, gout, depression, a-fib, anxiety, anemia    Diagnostic tests none recent    Patient Stated Goals decrease pain    Currently in Pain? Yes    Pain  Score 5     Pain Location Knee    Pain Orientation Left    Pain Descriptors / Indicators Aching    Pain Type Chronic pain              [facility] PT Assessment - 01/23/20 0001      Posture/Postural Control   Posture Comments moderate pes planus B and with increased calcaneal eversion on L vs. R      Standardized Balance Assessment   Standardized Balance Assessment Five Times Sit to Stand    Five times sit to stand comments  22.6   using B armrest                        [facility] Adult PT Treatment/Exercise - 01/23/20 0001      Knee/Hip Exercises: Stretches   Active Hamstring Stretch Right;Left;1 rep;30 seconds    Active Hamstring Stretch Limitations sitting with LE on stool    Gastroc Stretch Right;Left;30 seconds;3 reps    Gastroc Stretch Limitations sitting with strap and leg elevated on stool, runner's stretch, toes on wall stretch   pt  reporting inability to feel stretch     Knee/Hip Exercises: Standing   Hip ADduction --    Hip ADduction Limitations --    Hip Abduction Stengthening;Right;Left;1 set;10 reps;Knee straight    Abduction Limitations at counter    Hip Extension Stengthening;Right;Left;1 set;10 reps;Knee straight    Extension Limitations at counter    Functional Squat 1 set;10 reps    Functional Squat Limitations mini squat   cues to shift hips back   Other Standing Knee Exercises sidestepping with red loop around ankles 4x length of counter      Knee/Hip Exercises: Seated   Sit to Sand 1 set;5 reps;with UE support   1 UE support and cueing for set up and trunk lean forward                 PT Education - 01/23/20 1615    Education Details update to HEP; advised patient to remove KT tape from L knee after a shower to avoid skin irritation; edu on foot posture/positioning and beneficial shoes/orthotics    Person(s) Educated Patient    Methods Explanation;Demonstration;Tactile cues;Verbal cues;Handout    Comprehension Verbalized  understanding;Returned demonstration            PT Short Term Goals - 01/23/20 1621      PT SHORT TERM GOAL #1   Title Patient to be independent with initial HEP.    Time 2    Period Weeks    Status Achieved    Target Date 01/29/20             PT Long Term Goals - 01/21/20 1355      PT LONG TERM GOAL #1   Title Patient to be independent with advanced HEP.    Time 6    Period Weeks    Status On-going      PT LONG TERM GOAL #2   Title Patient to demonstrate L knee AROM symmetrical to opposite LE and without pain limiting.    Time 6    Period Weeks    Status On-going      PT LONG TERM GOAL #3   Title Patient to demonstrate B LE strength >/=4+/5.    Time 6    Period Weeks    Status On-going      PT LONG TERM GOAL #4   Title Patient to demonstrate 5xSTS in <15 seconds in order to decrease fall risk.    Time 6    Period Weeks    Status On-going      PT LONG TERM GOAL #5   Title Patient to report tolerance for L sidelying d/t improved pain.    Time 6    Period Weeks    Status On-going                 Plan - 01/23/20 1619    Clinical Impression Statement Patient reporting pain relief from KT tape, but still noting pain in the L knee from the weather change. Followed up on patient's question on eval about type of shoes to wear and if orthotics would be beneficial- upon observation, patient with moderate pes planus B and with increased calcaneal eversion on L vs. R, thus educated patient on walking shoes and orthotics that she may benefit from. Assessed 5xSTS test, with patient requiring use of armrests d/t weakness, with score indicating and increased risk of falls. Educated patient on proper set up with STS transfers, with encouragement to promote anterior trunk lean. Patient able  to complete transfer with 1 UE support after cueing. Proceeded with standing LE strengthening with good overall tolerance, but requiring cues for proper form. Updated STS transfers into  HEP- patient reported understanding and without complaints at end of session.    Comorbidities RTC arthropathy, recurrent vertigo, B knee OA, HLD, HTN, hyperglycemia, B hearing loss, gout, depression, a-fib, anxiety, anemia    PT Treatment/Interventions ADLs/Self Care Home Management;Cryotherapy;Electrical Stimulation;Iontophoresis 4mg /ml Dexamethasone;Moist Heat;Balance training;Therapeutic exercise;Therapeutic activities;Functional mobility training;Stair training;Gait training;DME Instruction;Ultrasound;Neuromuscular re-education;Patient/family education;Manual techniques;Vasopneumatic Device;Taping;Energy conservation;Dry needling;Passive range of motion    PT Next Visit Plan progress LE stretching and strengthening, STS    Consulted and Agree with Plan of Care Patient           Patient will benefit from skilled therapeutic intervention in order to improve the following deficits and impairments:  Abnormal gait, Decreased endurance, Hypomobility, Increased edema, Decreased activity tolerance, Decreased strength, Pain, Increased fascial restricitons, Increased muscle spasms, Difficulty walking, Decreased balance, Improper body mechanics, Decreased range of motion, Postural dysfunction, Impaired flexibility  Visit Diagnosis: Chronic pain of left knee  Stiffness of left knee, not elsewhere classified  Muscle weakness (generalized)  Difficulty in walking, not elsewhere classified     Problem List Patient Active Problem List   Diagnosis Date Noted  . Perennial allergic rhinitis with predominant nonallergic component 07/29/2019  . Chronic sinusitis/eustachian tube dysfunction 07/29/2019  . Eustachian tube dysfunction, bilateral 07/29/2019  . Dizziness 07/29/2019  . Low back pain 11/17/2016  . Lymphedema of both lower extremities 07/12/2016  . OA (osteoarthritis) of knee 08/11/2014  . Rotator cuff syndrome of left shoulder 08/11/2014     08/13/2014, PT, DPT 01/23/20 4:23  PM   [facility] [address]  [address] [place], [place], [place] Phone: [phone]   Fax:  [phone]  Name: Jennifer Hardin MRN: [id number] Cater Date of Birth: 1940/10/19

## 2020-01-28 ENCOUNTER — Ambulatory Visit: Payer: MEDICARE

## 2020-01-28 ENCOUNTER — Other Ambulatory Visit: Payer: Self-pay

## 2020-01-28 DIAGNOSIS — M25662 Stiffness of left knee, not elsewhere classified: Secondary | ICD-10-CM

## 2020-01-28 DIAGNOSIS — M25562 Pain in left knee: Secondary | ICD-10-CM

## 2020-01-28 DIAGNOSIS — R262 Difficulty in walking, not elsewhere classified: Secondary | ICD-10-CM

## 2020-01-28 DIAGNOSIS — G8929 Other chronic pain: Secondary | ICD-10-CM

## 2020-01-28 DIAGNOSIS — M6281 Muscle weakness (generalized): Secondary | ICD-10-CM

## 2020-01-28 NOTE — Therapy (Signed)
[facility] Outpatient [facility] [address]  [address] [place], [place], [address] Phone: [phone]   Fax:  [phone]  Physical Therapy Treatment  Patient Details  Name: Jennifer Hardin MRN: [id number] Date of Birth: 03-30-1940 Referring Provider (PT): Clare Gandy, MD   Encounter Date: 01/28/2020   PT End of Session - 01/28/20 1621    Visit Number 4    Number of Visits 13    Date for PT Re-Evaluation 02/26/20    Authorization Type Medicare & Federal BCBS    PT Start Time 1610    PT Stop Time 1650    PT Time Calculation (min) 40 min    Activity Tolerance Patient tolerated treatment well    Behavior During Therapy [facility] for tasks assessed/performed           Past Medical History:  Diagnosis Date  . Allergic rhinitis   . Anemia   . Anxiety   . Asymptomatic varicose veins   . Atrial fibrillation (HCC)   . Carotid stenosis    very mild (<39% by doppler 10/24/17 at [facility])  . Depression   . Gout   . Granulomatous lung disease (HCC)   . Hearing loss    bilateral  . Hyperglycemia   . Hypersomnia   . Hypertension    benign  . Left atrial enlargement   . Mixed hyperlipidemia   . MR (mitral regurgitation)   . Obesity   . Osteoarthritis, knee    bilateral  . Pulmonary nodules   . Recurrent vertigo   . Rotator cuff arthropathy   . Tendonitis    of elbow or forearm  . Thyroid nodule   . Vitamin D deficiency     Past Surgical History:  Procedure Laterality Date  . APPENDECTOMY    . MELANOMA EXCISION     ON BACK    There were no vitals filed for this visit.   Subjective Assessment - 01/28/20 1616    Subjective Pt. reports she is performing HEP.    Pertinent History RTC arthropathy, recurrent vertigo, B knee OA, HLD, HTN, hyperglycemia, B hearing loss, gout, depression, a-fib, anxiety, anemia    Diagnostic tests none recent    Patient Stated Goals decrease pain    Currently in Pain? Yes    Pain Score 3     Pain  Location Knee    Pain Orientation Left    Pain Descriptors / Indicators Aching    Pain Type Chronic pain    Multiple Pain Sites No                             [facility] Adult PT Treatment/Exercise - 01/28/20 0001      Knee/Hip Exercises: Stretches   Passive Hamstring Stretch Left;1 rep;60 seconds    Passive Hamstring Stretch Limitations supine with strap     Gastroc Stretch Right;Left;30 seconds;2 reps    Gastroc Stretch Limitations leaning into counter       Knee/Hip Exercises: Aerobic   Nustep L3 x 6 min (UEs/LEs)      Knee/Hip Exercises: Standing   Heel Raises Both;20 reps    Heel Raises Limitations counter     Terminal Knee Extension Left;10 reps;Strengthening    Terminal Knee Extension Limitations ball terminal kne extension     Hip Abduction Stengthening;Right;Left;1 set;10 reps;Knee straight    Abduction Limitations at counter    Functional Squat 1 set;10 reps  Functional Squat Limitations mini squat   chair behind for idea of posterior weight shift      Knee/Hip Exercises: Supine   Bridges Both;10 reps;Strengthening    Bridges Limitations cues for ROM    Straight Leg Raises Left;10 reps;Strengthening    Straight Leg Raises Limitations cues for quad set    Knee Flexion Both;AAROM;10 reps    Knee Flexion Limitations heels on peanut p-ball       Manual Therapy   Manual Therapy Taping    Kinesiotex Create Space      Kinesiotix   Create Space L knee chondromalacia patellae pattern with 50% stretch on 2 circumferential strips and 80% stretch on 2 superior/inferiro strips                     PT Short Term Goals - 01/23/20 1621      PT SHORT TERM GOAL #1   Title Patient to be independent with initial HEP.    Time 2    Period Weeks    Status Achieved    Target Date 01/29/20             PT Long Term Goals - 01/21/20 1355      PT LONG TERM GOAL #1   Title Patient to be independent with advanced HEP.    Time 6    Period Weeks     Status On-going      PT LONG TERM GOAL #2   Title Patient to demonstrate L knee AROM symmetrical to opposite LE and without pain limiting.    Time 6    Period Weeks    Status On-going      PT LONG TERM GOAL #3   Title Patient to demonstrate B LE strength >/=4+/5.    Time 6    Period Weeks    Status On-going      PT LONG TERM GOAL #4   Title Patient to demonstrate 5xSTS in <15 seconds in order to decrease fall risk.    Time 6    Period Weeks    Status On-going      PT LONG TERM GOAL #5   Title Patient to report tolerance for L sidelying d/t improved pain.    Time 6    Period Weeks    Status On-going                 Plan - 01/28/20 1623    Clinical Impression Statement Pt. reporting greater ease with STS movement since start therapy.  Tolerated mild progression of LE strengthening activities well focused on quad/VMO strengthening for improved knee support.  Ended visit pain free and notes greater ease getting into gross-legged sitting position to put on her shoes.  Re-applied K-taping to L knee to reduce pain as pt. noting good benefit from previous taping trial.    Comorbidities RTC arthropathy, recurrent vertigo, B knee OA, HLD, HTN, hyperglycemia, B hearing loss, gout, depression, a-fib, anxiety, anemia    Rehab Potential Good    PT Frequency 2x / week    PT Duration 6 weeks    PT Treatment/Interventions ADLs/Self Care Home Management;Cryotherapy;Electrical Stimulation;Iontophoresis 4mg /ml Dexamethasone;Moist Heat;Balance training;Therapeutic exercise;Therapeutic activities;Functional mobility training;Stair training;Gait training;DME Instruction;Ultrasound;Neuromuscular re-education;Patient/family education;Manual techniques;Vasopneumatic Device;Taping;Energy conservation;Dry needling;Passive range of motion    PT Next Visit Plan progress LE stretching and strengthening, STS    Consulted and Agree with Plan of Care Patient           Patient will benefit from  skilled  therapeutic intervention in order to improve the following deficits and impairments:  Abnormal gait, Decreased endurance, Hypomobility, Increased edema, Decreased activity tolerance, Decreased strength, Pain, Increased fascial restricitons, Increased muscle spasms, Difficulty walking, Decreased balance, Improper body mechanics, Decreased range of motion, Postural dysfunction, Impaired flexibility  Visit Diagnosis: Chronic pain of left knee  Stiffness of left knee, not elsewhere classified  Muscle weakness (generalized)  Difficulty in walking, not elsewhere classified     Problem List Patient Active Problem List   Diagnosis Date Noted  . Perennial allergic rhinitis with predominant nonallergic component 07/29/2019  . Chronic sinusitis/eustachian tube dysfunction 07/29/2019  . Eustachian tube dysfunction, bilateral 07/29/2019  . Dizziness 07/29/2019  . Low back pain 11/17/2016  . Lymphedema of both lower extremities 07/12/2016  . OA (osteoarthritis) of knee 08/11/2014  . Rotator cuff syndrome of left shoulder 08/11/2014    Kermit Balo, PTA 01/28/20 6:02 PM    [facility] [address]  [address] [place], [place], [address] Phone: [phone]   Fax:  [phone]  Name: JAYLEENE GLAESER MRN: [id number] Date of Birth: [birth year]

## 2020-01-29 ENCOUNTER — Ambulatory Visit: Payer: Federal, State, Local not specified - PPO | Admitting: Allergy and Immunology

## 2020-01-29 ENCOUNTER — Ambulatory Visit: Payer: MEDICARE | Admitting: Allergy and Immunology

## 2020-01-30 ENCOUNTER — Ambulatory Visit: Payer: MEDICARE

## 2020-01-30 ENCOUNTER — Other Ambulatory Visit: Payer: Self-pay

## 2020-01-30 DIAGNOSIS — M25562 Pain in left knee: Secondary | ICD-10-CM | POA: Diagnosis not present

## 2020-01-30 DIAGNOSIS — R262 Difficulty in walking, not elsewhere classified: Secondary | ICD-10-CM

## 2020-01-30 DIAGNOSIS — M25662 Stiffness of left knee, not elsewhere classified: Secondary | ICD-10-CM

## 2020-01-30 DIAGNOSIS — M6281 Muscle weakness (generalized): Secondary | ICD-10-CM

## 2020-01-30 DIAGNOSIS — G8929 Other chronic pain: Secondary | ICD-10-CM

## 2020-01-30 NOTE — Therapy (Signed)
[facility]patient Rehabilitation [facility] [address]  [address] [place], [place], [address] Phone: [phone]   Fax:  [phone]  Physical Therapy Treatment  Patient Details  Name: Jennifer Hardin MRN: [id number] Date of Birth: 1940-11-18 Referring Provider (PT): Clare Gandy, MD   Encounter Date: 01/30/2020   PT End of Session - 01/30/20 1320    Visit Number 5    Number of Visits 13    Date for PT Re-Evaluation 02/26/20    Authorization Type Medicare & Federal BCBS    PT Start Time 1315    PT Stop Time 1355    PT Time Calculation (min) 40 min    Activity Tolerance Patient tolerated treatment well    Behavior During Therapy [facility] for tasks assessed/performed           Past Medical History:  Diagnosis Date  . Allergic rhinitis   . Anemia   . Anxiety   . Asymptomatic varicose veins   . Atrial fibrillation (HCC)   . Carotid stenosis    very mild (<39% by doppler 10/24/17 at [facility])  . Depression   . Gout   . Granulomatous lung disease (HCC)   . Hearing loss    bilateral  . Hyperglycemia   . Hypersomnia   . Hypertension    benign  . Left atrial enlargement   . Mixed hyperlipidemia   . MR (mitral regurgitation)   . Obesity   . Osteoarthritis, knee    bilateral  . Pulmonary nodules   . Recurrent vertigo   . Rotator cuff arthropathy   . Tendonitis    of elbow or forearm  . Thyroid nodule   . Vitamin D deficiency     Past Surgical History:  Procedure Laterality Date  . APPENDECTOMY    . MELANOMA EXCISION     ON BACK    There were no vitals filed for this visit.   Subjective Assessment - 01/30/20 1319    Subjective Pt. doing ok.  Notes increased L knee pain which she attributes to the weather change.    Pertinent History RTC arthropathy, recurrent vertigo, B knee OA, HLD, HTN, hyperglycemia, B hearing loss, gout, depression, a-fib, anxiety, anemia    Diagnostic tests none recent    Patient Stated Goals decrease pain     Currently in Pain? Yes    Pain Score 2     Pain Location Knee    Pain Orientation Left    Pain Descriptors / Indicators Aching                             [facility] Adult PT Treatment/Exercise - 01/30/20 0001      Knee/Hip Exercises: Stretches   Gastroc Stretch Right;Left;30 seconds;2 reps    Gastroc Stretch Limitations leaning into counter       Knee/Hip Exercises: Aerobic   Nustep L4 x 6 min (UEs/LEs)      Knee/Hip Exercises: Standing   Heel Raises Both;20 reps    Heel Raises Limitations heel/toe while standing at TM    Knee Flexion Right;Left;10 reps;Strengthening    Knee Flexion Limitations standing at TM    Hip Flexion Right;Left;10 reps;Knee bent    Hip Flexion Limitations at TM rail     Hip Abduction Right;Left;10 reps;Knee straight    Abduction Limitations yellow looped TB at thighs     Hip Extension Right;Left;10 reps;Knee straight;Stengthening    Extension Limitations yellow looped  TB at thighs       Knee/Hip Exercises: Seated   Long Arc Quad Right;Left;10 reps;Strengthening    Long Arc Quad Limitations TKE     Other Seated Knee/Hip Exercises STS x 10 from chair + airex pad without UE push    Sit to Sand 10 reps;1 set;with UE support   from chair with armrests                    PT Short Term Goals - 01/23/20 1621      PT SHORT TERM GOAL #1   Title Patient to be independent with initial HEP.    Time 2    Period Weeks    Status Achieved    Target Date 01/29/20             PT Long Term Goals - 01/21/20 1355      PT LONG TERM GOAL #1   Title Patient to be independent with advanced HEP.    Time 6    Period Weeks    Status On-going      PT LONG TERM GOAL #2   Title Patient to demonstrate L knee AROM symmetrical to opposite LE and without pain limiting.    Time 6    Period Weeks    Status On-going      PT LONG TERM GOAL #3   Title Patient to demonstrate B LE strength >/=4+/5.    Time 6    Period Weeks    Status On-going       PT LONG TERM GOAL #4   Title Patient to demonstrate 5xSTS in <15 seconds in order to decrease fall risk.    Time 6    Period Weeks    Status On-going      PT LONG TERM GOAL #5   Title Patient to report tolerance for L sidelying d/t improved pain.    Time 6    Period Weeks    Status On-going                 Plan - 01/30/20 1321    Clinical Impression Statement Ayra without new complaints however does feel the weather change has increased her knee pain as is typical for her.  Progressed HS/quad, proximal hip strengthening activities.  Pt. with complaint of R knee "popping" without pain with sit<>stand.  Able to progress session without increased knee pain and ended session noting LE fatigue.    Comorbidities RTC arthropathy, recurrent vertigo, B knee OA, HLD, HTN, hyperglycemia, B hearing loss, gout, depression, a-fib, anxiety, anemia    Rehab Potential Good    PT Frequency 2x / week    PT Duration 6 weeks    PT Treatment/Interventions ADLs/Self Care Home Management;Cryotherapy;Electrical Stimulation;Iontophoresis 4mg /ml Dexamethasone;Moist Heat;Balance training;Therapeutic exercise;Therapeutic activities;Functional mobility training;Stair training;Gait training;DME Instruction;Ultrasound;Neuromuscular re-education;Patient/family education;Manual techniques;Vasopneumatic Device;Taping;Energy conservation;Dry needling;Passive range of motion    PT Next Visit Plan progress LE stretching and strengthening, STS    Consulted and Agree with Plan of Care Patient           Patient will benefit from skilled therapeutic intervention in order to improve the following deficits and impairments:  Abnormal gait, Decreased endurance, Hypomobility, Increased edema, Decreased activity tolerance, Decreased strength, Pain, Increased fascial restricitons, Increased muscle spasms, Difficulty walking, Decreased balance, Improper body mechanics, Decreased range of motion, Postural dysfunction,  Impaired flexibility  Visit Diagnosis: Chronic pain of left knee  Stiffness of left knee, not elsewhere classified  Muscle weakness (generalized)  Difficulty in walking, not elsewhere classified     Problem List Patient Active Problem List   Diagnosis Date Noted  . Perennial allergic rhinitis with predominant nonallergic component 07/29/2019  . Chronic sinusitis/eustachian tube dysfunction 07/29/2019  . Eustachian tube dysfunction, bilateral 07/29/2019  . Dizziness 07/29/2019  . Low back pain 11/17/2016  . Lymphedema of both lower extremities 07/12/2016  . OA (osteoarthritis) of knee 08/11/2014  . Rotator cuff syndrome of left shoulder 08/11/2014    Kermit Balo, PTA 01/30/20 2:22 PM   [facility] Outpatient [facility] [address]  [address] [place], [place], [address] Phone: [phone]   Fax:  [phone]  Name: ELIZABET SCHWEPPE MRN: [id number] Date of Birth: [birth year]

## 2020-02-03 ENCOUNTER — Encounter: Payer: Self-pay | Admitting: Physical Therapy

## 2020-02-03 ENCOUNTER — Ambulatory Visit: Payer: MEDICARE | Admitting: Physical Therapy

## 2020-02-03 ENCOUNTER — Other Ambulatory Visit: Payer: Self-pay

## 2020-02-03 DIAGNOSIS — R262 Difficulty in walking, not elsewhere classified: Secondary | ICD-10-CM

## 2020-02-03 DIAGNOSIS — M25562 Pain in left knee: Secondary | ICD-10-CM | POA: Diagnosis not present

## 2020-02-03 DIAGNOSIS — M6281 Muscle weakness (generalized): Secondary | ICD-10-CM

## 2020-02-03 DIAGNOSIS — G8929 Other chronic pain: Secondary | ICD-10-CM

## 2020-02-03 DIAGNOSIS — M25662 Stiffness of left knee, not elsewhere classified: Secondary | ICD-10-CM

## 2020-02-03 NOTE — Therapy (Signed)
[facility] Outpatient Rehabilitation [facility] [address]  [address] [place], [place], [address] Phone: [phone]   Fax:  [phone]  Physical Therapy Treatment  Patient Details  Name: Jennifer Hardin MRN: [id number] Date of Birth: 1941-03-12 Referring Provider (PT): Clare Gandy, MD   Encounter Date: 02/03/2020   PT End of Session - 02/03/20 1815    Visit Number 6    Number of Visits 13    Date for PT Re-Evaluation 02/26/20    Authorization Type Medicare & Federal BCBS    PT Start Time 1615    PT Stop Time 1658    PT Time Calculation (min) 43 min    Activity Tolerance Patient tolerated treatment well    Behavior During Therapy [facility] for tasks assessed/performed           Past Medical History:  Diagnosis Date  . Allergic rhinitis   . Anemia   . Anxiety   . Asymptomatic varicose veins   . Atrial fibrillation (HCC)   . Carotid stenosis    very mild (<39% by doppler 10/24/17 at [facility])  . Depression   . Gout   . Granulomatous lung disease (HCC)   . Hearing loss    bilateral  . Hyperglycemia   . Hypersomnia   . Hypertension    benign  . Left atrial enlargement   . Mixed hyperlipidemia   . MR (mitral regurgitation)   . Obesity   . Osteoarthritis, knee    bilateral  . Pulmonary nodules   . Recurrent vertigo   . Rotator cuff arthropathy   . Tendonitis    of elbow or forearm  . Thyroid nodule   . Vitamin D deficiency     Past Surgical History:  Procedure Laterality Date  . APPENDECTOMY    . MELANOMA EXCISION     ON BACK    There were no vitals filed for this visit.   Subjective Assessment - 02/03/20 1617    Subjective L knee has been a little sore for the last day or two.    Pertinent History RTC arthropathy, recurrent vertigo, B knee OA, HLD, HTN, hyperglycemia, B hearing loss, gout, depression, a-fib, anxiety, anemia    Diagnostic tests none recent    Patient Stated Goals decrease pain    Currently in Pain? Yes    Pain  Location Knee    Pain Orientation Left    Pain Descriptors / Indicators Aching    Pain Type Chronic pain                             [facility] Adult PT Treatment/Exercise - 02/03/20 0001      Ambulation/Gait   Ambulation Distance (Feet) 90 Feet    Assistive device Straight cane    Gait Pattern Step-through pattern;Step-to pattern;Decreased step length - right;Trunk flexed;Lateral hip instability;Decreased stance time - left    Gait Comments gait training with shortened SPC with cueing to increase R step length   pt noting improved support     Knee/Hip Exercises: Aerobic   Nustep L4 x 6 min (UEs/LEs)      Knee/Hip Exercises: Standing   Lateral Step Up Left;2 sets;5 reps;Hand Hold: 2;Step Height: 4";Step Height: 6"    Lateral Step Up Limitations good tolerance    Forward Step Up Left;1 set;10 reps;Hand Hold: 1;Step Height: 4"    Forward Step Up Limitations L step up/down with 1 UE support and SPC  c/o L knee pain and catching when attempting w/out [facility]     Knee/Hip Exercises: Seated   Sit to Sand 1 set;5 reps;with UE support   last rep without UE support     Manual Therapy   Manual Therapy Taping    Kinesiotex Create Space      Kinesiotix   Create Space L knee chondromalacia patellae pattern with 50% stretch on 2 circumferential strips and 80% stretch on 2 superior/inferiro strips    edu on self-taping for application at home                 PT Education - 02/03/20 1656    Education Details took video of patient's L knee being taped for improved carryover of self-application; edu on activities for continued physical fitness after completing PT    Person(s) Educated Patient    Methods Explanation;Demonstration;Tactile cues;Verbal cues;Handout    Comprehension Verbalized understanding;Returned demonstration            PT Short Term Goals - 01/23/20 1621      PT SHORT TERM GOAL #1   Title Patient to be independent with initial HEP.    Time 2     Period Weeks    Status Achieved    Target Date 01/29/20             PT Long Term Goals - 01/21/20 1355      PT LONG TERM GOAL #1   Title Patient to be independent with advanced HEP.    Time 6    Period Weeks    Status On-going      PT LONG TERM GOAL #2   Title Patient to demonstrate L knee AROM symmetrical to opposite LE and without pain limiting.    Time 6    Period Weeks    Status On-going      PT LONG TERM GOAL #3   Title Patient to demonstrate B LE strength >/=4+/5.    Time 6    Period Weeks    Status On-going      PT LONG TERM GOAL #4   Title Patient to demonstrate 5xSTS in <15 seconds in order to decrease fall risk.    Time 6    Period Weeks    Status On-going      PT LONG TERM GOAL #5   Title Patient to report tolerance for L sidelying d/t improved pain.    Time 6    Period Weeks    Status On-going                 Plan - 02/03/20 1815    Clinical Impression Statement Patient noting increased L knee soreness for the past couple of days without known cause. Does report good benefit from KT tape. Thus educated patient on application of KT tape to the L knee for hopeful management of pain at home. Adjusted SPC height and worked on increasing R step length to improve on antalgia- patient noted good improvement in stability with shorter pain. Patient performed step up/downs with slight buckling and c/o pain with forward vs. lateral step ups. Better tolerance with use of SPC. Patient able to demonstrate STS without UE support today- much improved from previous sessions. No complaints at end of session. Patient progressing well towards goals.    Comorbidities RTC arthropathy, recurrent vertigo, B knee OA, HLD, HTN, hyperglycemia, B hearing loss, gout, depression, a-fib, anxiety, anemia    Rehab Potential Good    PT Frequency 2x /  week    PT Duration 6 weeks    PT Treatment/Interventions ADLs/Self Care Home Management;Cryotherapy;Electrical  Stimulation;Iontophoresis 4mg /ml Dexamethasone;Moist Heat;Balance training;Therapeutic exercise;Therapeutic activities;Functional mobility training;Stair training;Gait training;DME Instruction;Ultrasound;Neuromuscular re-education;Patient/family education;Manual techniques;Vasopneumatic Device;Taping;Energy conservation;Dry needling;Passive range of motion    PT Next Visit Plan progress LE stretching and strengthening, STS    Consulted and Agree with Plan of Care Patient           Patient will benefit from skilled therapeutic intervention in order to improve the following deficits and impairments:  Abnormal gait, Decreased endurance, Hypomobility, Increased edema, Decreased activity tolerance, Decreased strength, Pain, Increased fascial restricitons, Increased muscle spasms, Difficulty walking, Decreased balance, Improper body mechanics, Decreased range of motion, Postural dysfunction, Impaired flexibility  Visit Diagnosis: Chronic pain of left knee  Stiffness of left knee, not elsewhere classified  Muscle weakness (generalized)  Difficulty in walking, not elsewhere classified     Problem List Patient Active Problem List   Diagnosis Date Noted  . Perennial allergic rhinitis with predominant nonallergic component 07/29/2019  . Chronic sinusitis/eustachian tube dysfunction 07/29/2019  . Eustachian tube dysfunction, bilateral 07/29/2019  . Dizziness 07/29/2019  . Low back pain 11/17/2016  . Lymphedema of both lower extremities 07/12/2016  . OA (osteoarthritis) of knee 08/11/2014  . Rotator cuff syndrome of left shoulder 08/11/2014     08/13/2014, PT, DPT 02/03/20 6:19 PM   [facility] Outpatient Rehabilitation [facility] [address]  [address] [place], [place], [place] Phone: [phone]   Fax:  [phone]  Name: Jennifer Hardin MRN: [id number] Date of Birth: 09-Mar-1941

## 2020-02-06 ENCOUNTER — Ambulatory Visit: Payer: MEDICARE

## 2020-02-06 ENCOUNTER — Other Ambulatory Visit: Payer: Self-pay

## 2020-02-06 DIAGNOSIS — M25562 Pain in left knee: Secondary | ICD-10-CM | POA: Diagnosis not present

## 2020-02-06 DIAGNOSIS — M25662 Stiffness of left knee, not elsewhere classified: Secondary | ICD-10-CM

## 2020-02-06 DIAGNOSIS — G8929 Other chronic pain: Secondary | ICD-10-CM

## 2020-02-06 DIAGNOSIS — R262 Difficulty in walking, not elsewhere classified: Secondary | ICD-10-CM

## 2020-02-06 DIAGNOSIS — M6281 Muscle weakness (generalized): Secondary | ICD-10-CM

## 2020-02-06 NOTE — Therapy (Signed)
[facility] Outpatient Rehabilitation [facility] [address]  [address] [place], [place], [address] Phone: [phone]   Fax:  [phone]  Physical Therapy Treatment  Patient Details  Name: Jennifer Hardin MRN: [id number] Date of Birth: Oct 20, 1940 Referring Provider (PT): Clare Gandy, MD   Encounter Date: 02/06/2020   PT End of Session - 02/06/20 1632    Visit Number 7    Number of Visits 13    Date for PT Re-Evaluation 02/26/20    Authorization Type Medicare & Federal BCBS    PT Start Time 1618    PT Stop Time 1700    PT Time Calculation (min) 42 min    Activity Tolerance Patient tolerated treatment well    Behavior During Therapy [facility] for tasks assessed/performed           Past Medical History:  Diagnosis Date  . Allergic rhinitis   . Anemia   . Anxiety   . Asymptomatic varicose veins   . Atrial fibrillation (HCC)   . Carotid stenosis    very mild (<39% by doppler 10/24/17 at [facility])  . Depression   . Gout   . Granulomatous lung disease (HCC)   . Hearing loss    bilateral  . Hyperglycemia   . Hypersomnia   . Hypertension    benign  . Left atrial enlargement   . Mixed hyperlipidemia   . MR (mitral regurgitation)   . Obesity   . Osteoarthritis, knee    bilateral  . Pulmonary nodules   . Recurrent vertigo   . Rotator cuff arthropathy   . Tendonitis    of elbow or forearm  . Thyroid nodule   . Vitamin D deficiency     Past Surgical History:  Procedure Laterality Date  . APPENDECTOMY    . MELANOMA EXCISION     ON BACK    There were no vitals filed for this visit.   Subjective Assessment - 02/06/20 1626    Subjective Pt. noting benefit from taping in past visits.    Pertinent History RTC arthropathy, recurrent vertigo, B knee OA, HLD, HTN, hyperglycemia, B hearing loss, gout, depression, a-fib, anxiety, anemia    Diagnostic tests none recent    Patient Stated Goals decrease pain    Currently in Pain? Yes    Pain Score 2      Pain Location Knee    Pain Orientation Left    Pain Descriptors / Indicators Aching    Pain Type Chronic pain    Multiple Pain Sites Yes    Pain Score 2    Pain Location Knee    Pain Orientation Right    Pain Descriptors / Indicators Aching    Pain Type Acute pain    Pain Frequency Constant                             [facility] Adult PT Treatment/Exercise - 02/06/20 0001      Knee/Hip Exercises: Stretches   Gastroc Stretch Right;Left;30 seconds;2 reps    Gastroc Stretch Limitations leaning into counter       Knee/Hip Exercises: Aerobic   Nustep L4 x 6 min (UEs/LEs)      Knee/Hip Exercises: Standing   Heel Raises Both;20 reps    Heel Raises Limitations heel raise     Forward Step Up Right;Left;2 sets;5 reps    Forward Step Up Limitations 7" step up in stairwell  Functional Squat 1 set;10 reps    Functional Squat Limitations squat to chair + aire pad holding onto counter       Knee/Hip Exercises: Seated   Sit to Sand 10 reps;1 set;with UE support   1 hand pushoff from mat table                    PT Short Term Goals - 01/23/20 1621      PT SHORT TERM GOAL #1   Title Patient to be independent with initial HEP.    Time 2    Period Weeks    Status Achieved    Target Date 01/29/20             PT Long Term Goals - 01/21/20 1355      PT LONG TERM GOAL #1   Title Patient to be independent with advanced HEP.    Time 6    Period Weeks    Status On-going      PT LONG TERM GOAL #2   Title Patient to demonstrate L knee AROM symmetrical to opposite LE and without pain limiting.    Time 6    Period Weeks    Status On-going      PT LONG TERM GOAL #3   Title Patient to demonstrate B LE strength >/=4+/5.    Time 6    Period Weeks    Status On-going      PT LONG TERM GOAL #4   Title Patient to demonstrate 5xSTS in <15 seconds in order to decrease fall risk.    Time 6    Period Weeks    Status On-going      PT LONG TERM GOAL #5    Title Patient to report tolerance for L sidelying d/t improved pain.    Time 6    Period Weeks    Status On-going                 Plan - 02/06/20 1705    Clinical Impression Statement Pt. doing well.  Notes benefit from K-taping to R knee.  Feels she is improving with therapy.  Progressed LE strengthening with step-ups and squats without issue.  Updated HEP with activities to progress strengthening at home for LE with handout issued to pt.    Comorbidities RTC arthropathy, recurrent vertigo, B knee OA, HLD, HTN, hyperglycemia, B hearing loss, gout, depression, a-fib, anxiety, anemia    Rehab Potential Good    PT Frequency 2x / week    PT Duration 6 weeks    PT Treatment/Interventions ADLs/Self Care Home Management;Cryotherapy;Electrical Stimulation;Iontophoresis 4mg /ml Dexamethasone;Moist Heat;Balance training;Therapeutic exercise;Therapeutic activities;Functional mobility training;Stair training;Gait training;DME Instruction;Ultrasound;Neuromuscular re-education;Patient/family education;Manual techniques;Vasopneumatic Device;Taping;Energy conservation;Dry needling;Passive range of motion    PT Next Visit Plan progress LE stretching and strengthening, STS    Consulted and Agree with Plan of Care Patient           Patient will benefit from skilled therapeutic intervention in order to improve the following deficits and impairments:  Abnormal gait, Decreased endurance, Hypomobility, Increased edema, Decreased activity tolerance, Decreased strength, Pain, Increased fascial restricitons, Increased muscle spasms, Difficulty walking, Decreased balance, Improper body mechanics, Decreased range of motion, Postural dysfunction, Impaired flexibility  Visit Diagnosis: Chronic pain of left knee  Stiffness of left knee, not elsewhere classified  Muscle weakness (generalized)  Difficulty in walking, not elsewhere classified     Problem List Patient Active Problem List   Diagnosis Date  Noted  . Perennial  allergic rhinitis with predominant nonallergic component 07/29/2019  . Chronic sinusitis/eustachian tube dysfunction 07/29/2019  . Eustachian tube dysfunction, bilateral 07/29/2019  . Dizziness 07/29/2019  . Low back pain 11/17/2016  . Lymphedema of both lower extremities 07/12/2016  . OA (osteoarthritis) of knee 08/11/2014  . Rotator cuff syndrome of left shoulder 08/11/2014    Kermit Balo, PTA 02/06/20 5:17 PM   [facility] [address]  [address] [place], [place], [address] Phone: [phone]   Fax:  [phone]  Name: Jennifer Hardin MRN: [id number] Date of Birth: May 20, 1940

## 2020-02-10 ENCOUNTER — Ambulatory Visit: Payer: MEDICARE

## 2020-02-10 ENCOUNTER — Other Ambulatory Visit: Payer: Self-pay

## 2020-02-10 DIAGNOSIS — M6281 Muscle weakness (generalized): Secondary | ICD-10-CM

## 2020-02-10 DIAGNOSIS — G8929 Other chronic pain: Secondary | ICD-10-CM

## 2020-02-10 DIAGNOSIS — M25662 Stiffness of left knee, not elsewhere classified: Secondary | ICD-10-CM

## 2020-02-10 DIAGNOSIS — M25562 Pain in left knee: Secondary | ICD-10-CM | POA: Diagnosis not present

## 2020-02-10 DIAGNOSIS — R262 Difficulty in walking, not elsewhere classified: Secondary | ICD-10-CM

## 2020-02-10 NOTE — Therapy (Signed)
[facility] Outpatient Rehabilitation [facility] [address]  [address] [place], [place], [address] Phone: [phone]   Fax:  [phone]  Physical Therapy Treatment  Patient Details  Name: Jennifer Hardin MRN: [id number] Date of Birth: May 30, 1940 Referring Provider (PT): Clare Gandy, MD   Encounter Date: 02/10/2020   PT End of Session - 02/10/20 1630    Visit Number 8    Number of Visits 13    Date for PT Re-Evaluation 02/26/20    Authorization Type Medicare & Federal BCBS    PT Start Time 1615    PT Stop Time 1653    PT Time Calculation (min) 38 min    Activity Tolerance Patient tolerated treatment well    Behavior During Therapy [facility] for tasks assessed/performed           Past Medical History:  Diagnosis Date  . Allergic rhinitis   . Anemia   . Anxiety   . Asymptomatic varicose veins   . Atrial fibrillation (HCC)   . Carotid stenosis    very mild (<39% by doppler 10/24/17 at [facility])  . Depression   . Gout   . Granulomatous lung disease (HCC)   . Hearing loss    bilateral  . Hyperglycemia   . Hypersomnia   . Hypertension    benign  . Left atrial enlargement   . Mixed hyperlipidemia   . MR (mitral regurgitation)   . Obesity   . Osteoarthritis, knee    bilateral  . Pulmonary nodules   . Recurrent vertigo   . Rotator cuff arthropathy   . Tendonitis    of elbow or forearm  . Thyroid nodule   . Vitamin D deficiency     Past Surgical History:  Procedure Laterality Date  . APPENDECTOMY    . MELANOMA EXCISION     ON BACK    There were no vitals filed for this visit.   Subjective Assessment - 02/10/20 1621    Subjective Pt. doing ok.    Pertinent History RTC arthropathy, recurrent vertigo, B knee OA, HLD, HTN, hyperglycemia, B hearing loss, gout, depression, a-fib, anxiety, anemia    Diagnostic tests none recent    Patient Stated Goals decrease pain    Currently in Pain? Yes    Pain Score 2     Pain Location Knee    Pain  Orientation Left    Pain Descriptors / Indicators Aching    Pain Type Chronic pain                             [facility] Adult PT Treatment/Exercise - 02/10/20 0001      Knee/Hip Exercises: Stretches   Passive Hamstring Stretch Right;Left;1 rep;30 seconds    Passive Hamstring Stretch Limitations seated with heel prop on stool     Gastroc Stretch Right;Left;30 seconds;2 reps    Gastroc Stretch Limitations leaning into counter     Other Knee/Hip Stretches 3-way seated lumbar stretch leaning over red p-ball x 20 sec       Knee/Hip Exercises: Aerobic   Nustep L4 x 6 min (UEs/LEs)      Knee/Hip Exercises: Standing   Heel Raises Both;20 reps    Heel Raises Limitations heel raise     Knee Flexion Right;Left;10 reps    Knee Flexion Limitations 2#    Hip Flexion Right;Left;10 reps;Knee bent;Stengthening    Hip Flexion Limitations 2#      Knee/Hip  Exercises: Seated   Long Arc Quad Right;Left;10 reps;Weights;Strengthening    Long Arc Quad Weight 2 lbs.    Long Arc Quad Limitations TKE     Sit to [employer] 10 reps;with UE support;2 sets   1st set 1 hand pushoff from chair; 2nd set no hands airex pa     Knee/Hip Exercises: Supine   Quad Sets Strengthening;Left;1 set;10 reps                  PT Education - 02/10/20 1800    Education Details HEP update    Person(s) Educated Patient    Methods Explanation;Demonstration;Verbal cues;Handout    Comprehension Verbalized understanding;Returned demonstration;Verbal cues required            PT Short Term Goals - 01/23/20 1621      PT SHORT TERM GOAL #1   Title Patient to be independent with initial HEP.    Time 2    Period Weeks    Status Achieved    Target Date 01/29/20             PT Long Term Goals - 01/21/20 1355      PT LONG TERM GOAL #1   Title Patient to be independent with advanced HEP.    Time 6    Period Weeks    Status On-going      PT LONG TERM GOAL #2   Title Patient to demonstrate L knee  AROM symmetrical to opposite LE and without pain limiting.    Time 6    Period Weeks    Status On-going      PT LONG TERM GOAL #3   Title Patient to demonstrate B LE strength >/=4+/5.    Time 6    Period Weeks    Status On-going      PT LONG TERM GOAL #4   Title Patient to demonstrate 5xSTS in <15 seconds in order to decrease fall risk.    Time 6    Period Weeks    Status On-going      PT LONG TERM GOAL #5   Title Patient to report tolerance for L sidelying d/t improved pain.    Time 6    Period Weeks    Status On-going                 Plan - 02/10/20 1635    Clinical Impression Statement Pt. doing ok.  Notes improvement with physical therapy.  Progressed LE strengthening activities focusing on activities to improve pt. ability for STS.  Progressed and updated HEP with handout issued to pt. for activities well tolerated.    Comorbidities RTC arthropathy, recurrent vertigo, B knee OA, HLD, HTN, hyperglycemia, B hearing loss, gout, depression, a-fib, anxiety, anemia    Rehab Potential Good    PT Frequency 2x / week    PT Treatment/Interventions ADLs/Self Care Home Management;Cryotherapy;Electrical Stimulation;Iontophoresis 4mg /ml Dexamethasone;Moist Heat;Balance training;Therapeutic exercise;Therapeutic activities;Functional mobility training;Stair training;Gait training;DME Instruction;Ultrasound;Neuromuscular re-education;Patient/family education;Manual techniques;Vasopneumatic Device;Taping;Energy conservation;Dry needling;Passive range of motion    PT Next Visit Plan progress LE stretching and strengthening, STS    Consulted and Agree with Plan of Care Patient           Patient will benefit from skilled therapeutic intervention in order to improve the following deficits and impairments:  Abnormal gait, Decreased endurance, Hypomobility, Increased edema, Decreased activity tolerance, Decreased strength, Pain, Increased fascial restricitons, Increased muscle spasms,  Difficulty walking, Decreased balance, Improper body mechanics, Decreased range of motion, Postural dysfunction,  Impaired flexibility  Visit Diagnosis: Chronic pain of left knee  Stiffness of left knee, not elsewhere classified  Muscle weakness (generalized)  Difficulty in walking, not elsewhere classified     Problem List Patient Active Problem List   Diagnosis Date Noted  . Perennial allergic rhinitis with predominant nonallergic component 07/29/2019  . Chronic sinusitis/eustachian tube dysfunction 07/29/2019  . Eustachian tube dysfunction, bilateral 07/29/2019  . Dizziness 07/29/2019  . Low back pain 11/17/2016  . Lymphedema of both lower extremities 07/12/2016  . OA (osteoarthritis) of knee 08/11/2014  . Rotator cuff syndrome of left shoulder 08/11/2014    Kermit Balo, PTA 02/10/20 6:01 PM  [facility] Outpatient Rehabilitation [facility] [address]  [address] [place], [place], [address] Phone: [phone]   Fax:  [phone]  Name: Jennifer Hardin MRN: [id number] Date of Birth: June 10, 1940

## 2020-02-12 ENCOUNTER — Ambulatory Visit: Payer: MEDICARE | Admitting: Physical Therapy

## 2020-02-12 ENCOUNTER — Encounter: Payer: Self-pay | Admitting: Physical Therapy

## 2020-02-12 ENCOUNTER — Other Ambulatory Visit: Payer: Self-pay

## 2020-02-12 DIAGNOSIS — M25662 Stiffness of left knee, not elsewhere classified: Secondary | ICD-10-CM

## 2020-02-12 DIAGNOSIS — G8929 Other chronic pain: Secondary | ICD-10-CM

## 2020-02-12 DIAGNOSIS — M25562 Pain in left knee: Secondary | ICD-10-CM

## 2020-02-12 DIAGNOSIS — M6281 Muscle weakness (generalized): Secondary | ICD-10-CM

## 2020-02-12 DIAGNOSIS — R262 Difficulty in walking, not elsewhere classified: Secondary | ICD-10-CM

## 2020-02-12 NOTE — Therapy (Signed)
[facility] [address]  [address] [place], [place], [address] Phone: [phone]   Fax:  [phone]  Physical Therapy Treatment  Patient Details  Name: Jennifer Hardin MRN: [id number] Date of Birth: December 26, 1940 Referring Provider (PT): Clare Gandy, MD   Encounter Date: 02/12/2020   PT End of Session - 02/12/20 1354    Visit Number 9    Number of Visits 13    Date for PT Re-Evaluation 02/26/20    Authorization Type Medicare & Federal BCBS    PT Start Time 1311    PT Stop Time 1354    PT Time Calculation (min) 43 min    Activity Tolerance Patient tolerated treatment well    Behavior During Therapy [facility] for tasks assessed/performed           Past Medical History:  Diagnosis Date  . Allergic rhinitis   . Anemia   . Anxiety   . Asymptomatic varicose veins   . Atrial fibrillation (HCC)   . Carotid stenosis    very mild (<39% by doppler 10/24/17 at [facility])  . Depression   . Gout   . Granulomatous lung disease (HCC)   . Hearing loss    bilateral  . Hyperglycemia   . Hypersomnia   . Hypertension    benign  . Left atrial enlargement   . Mixed hyperlipidemia   . MR (mitral regurgitation)   . Obesity   . Osteoarthritis, knee    bilateral  . Pulmonary nodules   . Recurrent vertigo   . Rotator cuff arthropathy   . Tendonitis    of elbow or forearm  . Thyroid nodule   . Vitamin D deficiency     Past Surgical History:  Procedure Laterality Date  . APPENDECTOMY    . MELANOMA EXCISION     ON BACK    There were no vitals filed for this visit.   Subjective Assessment - 02/12/20 1312    Subjective Has been doing pretty well lately.    Pertinent History RTC arthropathy, recurrent vertigo, B knee OA, HLD, HTN, hyperglycemia, B hearing loss, gout, depression, a-fib, anxiety, anemia    Diagnostic tests none recent    Patient Stated Goals decrease pain    Currently in Pain? Yes    Pain Score 2     Pain  Location Knee    Pain Orientation Left    Pain Descriptors / Indicators Aching    Pain Type Chronic pain                             [facility] Adult PT Treatment/Exercise - 02/12/20 0001      Knee/Hip Exercises: Aerobic   Nustep L4 x 6 min (UEs/LEs)      Knee/Hip Exercises: Machines for Strengthening   Cybex Knee Extension B LEs 20# 2x10   good tolerance   Cybex Knee Flexion B LEs 20# 2x10      Knee/Hip Exercises: Standing   Terminal Knee Extension Strengthening;Left;1 set;10 reps    Terminal Knee Extension Limitations 10x3" with ball against counter    Other Standing Knee Exercises sidestepping with red loop around ankles 4x length of counter      Knee/Hip Exercises: Seated   Sit to Sand 1 set;without UE support;15 reps   sitting on airex; red TB above knees; last 5 sitting on pill  PT Short Term Goals - 01/23/20 1621      PT SHORT TERM GOAL #1   Title Patient to be independent with initial HEP.    Time 2    Period Weeks    Status Achieved    Target Date 01/29/20             PT Long Term Goals - 01/21/20 1355      PT LONG TERM GOAL #1   Title Patient to be independent with advanced HEP.    Time 6    Period Weeks    Status On-going      PT LONG TERM GOAL #2   Title Patient to demonstrate L knee AROM symmetrical to opposite LE and without pain limiting.    Time 6    Period Weeks    Status On-going      PT LONG TERM GOAL #3   Title Patient to demonstrate B LE strength >/=4+/5.    Time 6    Period Weeks    Status On-going      PT LONG TERM GOAL #4   Title Patient to demonstrate 5xSTS in <15 seconds in order to decrease fall risk.    Time 6    Period Weeks    Status On-going      PT LONG TERM GOAL #5   Title Patient to report tolerance for L sidelying d/t improved pain.    Time 6    Period Weeks    Status On-going                 Plan - 02/12/20 1354    Clinical Impression Statement Patient arrived  to session without new complaints. Worked on progressive B LE machine strengthening with patient demonstrating limited eccentric control, requiring cues to correct with good carryover. Patient reports remaining difficulty getting up off of the floor d/t her L knee, however notes improved ability to stand up from a chair and perform tub transfers. Does also report some fear of falling d/t some instability. Patient able to perform STS with banded resistance without UE support and with very slightly elevated seat with excellent set up and execution. Patient tolerated session well and without complaints upon leaving. Plan for progress note next session.    Comorbidities RTC arthropathy, recurrent vertigo, B knee OA, HLD, HTN, hyperglycemia, B hearing loss, gout, depression, a-fib, anxiety, anemia    Rehab Potential Good    PT Frequency 2x / week    PT Treatment/Interventions ADLs/Self Care Home Management;Cryotherapy;Electrical Stimulation;Iontophoresis 4mg /ml Dexamethasone;Moist Heat;Balance training;Therapeutic exercise;Therapeutic activities;Functional mobility training;Stair training;Gait training;DME Instruction;Ultrasound;Neuromuscular re-education;Patient/family education;Manual techniques;Vasopneumatic Device;Taping;Energy conservation;Dry needling;Passive range of motion    PT Next Visit Plan progress LE stretching and strengthening, STS    Consulted and Agree with Plan of Care Patient           Patient will benefit from skilled therapeutic intervention in order to improve the following deficits and impairments:  Abnormal gait, Decreased endurance, Hypomobility, Increased edema, Decreased activity tolerance, Decreased strength, Pain, Increased fascial restricitons, Increased muscle spasms, Difficulty walking, Decreased balance, Improper body mechanics, Decreased range of motion, Postural dysfunction, Impaired flexibility  Visit Diagnosis: Chronic pain of left knee  Stiffness of left knee, not  elsewhere classified  Muscle weakness (generalized)  Difficulty in walking, not elsewhere classified     Problem List Patient Active Problem List   Diagnosis Date Noted  . Perennial allergic rhinitis with predominant nonallergic component 07/29/2019  . Chronic sinusitis/eustachian tube dysfunction 07/29/2019  .  Eustachian tube dysfunction, bilateral 07/29/2019  . Dizziness 07/29/2019  . Low back pain 11/17/2016  . Lymphedema of both lower extremities 07/12/2016  . OA (osteoarthritis) of knee 08/11/2014  . Rotator cuff syndrome of left shoulder 08/11/2014     Anette Guarneri, PT, DPT 02/12/20 2:16 PM   [facility] [address]  [address] [place], [place], [address] Phone: [phone]   Fax:  [phone]  Name: Jennifer Hardin MRN: [id number] Date of Birth: 1940-07-01

## 2020-02-18 ENCOUNTER — Encounter: Payer: Self-pay | Admitting: Family Medicine

## 2020-02-18 ENCOUNTER — Other Ambulatory Visit: Payer: Self-pay

## 2020-02-18 ENCOUNTER — Ambulatory Visit (INDEPENDENT_AMBULATORY_CARE_PROVIDER_SITE_OTHER): Payer: MEDICARE | Admitting: Family Medicine

## 2020-02-18 DIAGNOSIS — M170 Bilateral primary osteoarthritis of knee: Secondary | ICD-10-CM | POA: Diagnosis present

## 2020-02-18 NOTE — Patient Instructions (Signed)
Good to see you Let me know if you need a referral to physical therapy  Please send me a message in MyChart with any questions or updates.  We check on the gel injections and get back to you.   --Dr. Jordan Likes

## 2020-02-18 NOTE — Assessment & Plan Note (Signed)
She has noticed improvement with physical therapy.  Pain is intermittent and mild. -Counseled on home exercise therapy and supportive care. -We will try to obtain gel injections. -Could consider imaging at some point.

## 2020-02-18 NOTE — Progress Notes (Signed)
Jennifer Hardin - 79 y.o. female MRN [id number]  Date of birth: 12/28/1940  SUBJECTIVE:  Including CC & ROS.  Chief Complaint  Patient presents with  . Follow-up    bilateral knee    Jennifer Hardin is a 79 y.o. female that is following up for bilateral knee pain.  She has noticed improvement with physical therapy.  The pain is intermittent in nature.  She does use a walking cane.   Review of Systems See HPI   HISTORY: Past Medical, Surgical, Social, and Family History Reviewed & Updated per EMR.   Pertinent Historical Findings include:  Past Medical History:  Diagnosis Date  . Allergic rhinitis   . Anemia   . Anxiety   . Asymptomatic varicose veins   . Atrial fibrillation (HCC)   . Carotid stenosis    very mild (<39% by doppler 10/24/17 at [facility])  . Depression   . Gout   . Granulomatous lung disease (HCC)   . Hearing loss    bilateral  . Hyperglycemia   . Hypersomnia   . Hypertension    benign  . Left atrial enlargement   . Mixed hyperlipidemia   . MR (mitral regurgitation)   . Obesity   . Osteoarthritis, knee    bilateral  . Pulmonary nodules   . Recurrent vertigo   . Rotator cuff arthropathy   . Tendonitis    of elbow or forearm  . Thyroid nodule   . Vitamin D deficiency     Past Surgical History:  Procedure Laterality Date  . APPENDECTOMY    . MELANOMA EXCISION     ON BACK    Family History  Problem Relation Age of Onset  . Hyperlipidemia Sister   . Hypertension Brother     Social History   Socioeconomic History  . Marital status: Divorced    Spouse name: Not on file  . Number of children: Not on file  . Years of education: Not on file  . Highest education level: Not on file  Occupational History  . Not on file  Tobacco Use  . Smoking status: Never Smoker  . Smokeless tobacco: Never Used  Vaping Use  . Vaping Use: Never used  Substance and Sexual Activity  . Alcohol use: Never  . Drug use: Never  . Sexual activity: Not on file  Other  Topics Concern  . Not on file  Social History Narrative   Lives in [facility]   Substitute teacher   Social Determinants of Health   Financial Resource Strain:   . Difficulty of Paying Living Expenses: Not on file  Food Insecurity:   . Worried About Programme researcher, broadcasting/film/video in the Last Year: Not on file  . Ran Out of Food in the Last Year: Not on file  Transportation Needs:   . Lack of Transportation (Medical): Not on file  . Lack of Transportation (Non-Medical): Not on file  Physical Activity:   . Days of Exercise per Week: Not on file  . Minutes of Exercise per Session: Not on file  Stress:   . Feeling of Stress : Not on file  Social Connections:   . Frequency of Communication with Friends and Family: Not on file  . Frequency of Social Gatherings with Friends and Family: Not on file  . Attends Religious Services: Not on file  . Active Member of Clubs or Organizations: Not on file  . Attends Banker Meetings: Not on file  . Marital Status:  Not on file  Intimate Partner Violence:   . Fear of Current or Ex-Partner: Not on file  . Emotionally Abused: Not on file  . Physically Abused: Not on file  . Sexually Abused: Not on file     PHYSICAL EXAM:  VS: BP 117/72   Pulse 76   Ht 5\' 7"  (1.702 m)   Wt 195 lb (88.5 kg)   BMI 30.54 kg/m  Physical Exam Gen: NAD, alert, cooperative with exam, well-appearing   ASSESSMENT & PLAN:   OA (osteoarthritis) of knee She has noticed improvement with physical therapy.  Pain is intermittent and mild. -Counseled on home exercise therapy and supportive care. -We will try to obtain gel injections. -Could consider imaging at some point.

## 2020-02-25 ENCOUNTER — Other Ambulatory Visit: Payer: Self-pay

## 2020-02-25 ENCOUNTER — Encounter: Payer: Self-pay | Admitting: Physical Therapy

## 2020-02-25 ENCOUNTER — Ambulatory Visit: Payer: MEDICARE | Attending: Family Medicine | Admitting: Physical Therapy

## 2020-02-25 DIAGNOSIS — G8929 Other chronic pain: Secondary | ICD-10-CM | POA: Diagnosis present

## 2020-02-25 DIAGNOSIS — M6281 Muscle weakness (generalized): Secondary | ICD-10-CM | POA: Insufficient documentation

## 2020-02-25 DIAGNOSIS — R2681 Unsteadiness on feet: Secondary | ICD-10-CM | POA: Insufficient documentation

## 2020-02-25 DIAGNOSIS — M25662 Stiffness of left knee, not elsewhere classified: Secondary | ICD-10-CM | POA: Insufficient documentation

## 2020-02-25 DIAGNOSIS — M25562 Pain in left knee: Secondary | ICD-10-CM | POA: Insufficient documentation

## 2020-02-25 DIAGNOSIS — R262 Difficulty in walking, not elsewhere classified: Secondary | ICD-10-CM | POA: Insufficient documentation

## 2020-02-25 NOTE — Therapy (Signed)
[facility] [address]  [place], [place], [address] Phone: [phone]   Fax:  [phone]  Physical Therapy Progress Note  Patient Details  Name: Jennifer Hardin MRN: [id number] Date of Birth: 02-28-1941 Referring Provider (PT): Clearance Coots, MD   Progress Note Reporting Period 01/15/20 to 02/25/20  See note below for Objective Data and Assessment of Progress/Goals.     Encounter Date: 02/25/2020   PT End of Session - 02/25/20 1154    Visit Number 10    Number of Visits 16    Date for PT Re-Evaluation 04/07/20    Authorization Type Medicare & Federal BCBS    PT Start Time 1102    PT Stop Time 1147    PT Time Calculation (min) 45 min    Equipment Utilized During Treatment Gait belt    Activity Tolerance Patient tolerated treatment well    Behavior During Therapy WFL for tasks assessed/performed           Past Medical History:  Diagnosis Date  . Allergic rhinitis   . Anemia   . Anxiety   . Asymptomatic varicose veins   . Atrial fibrillation ([place])   . Carotid stenosis    very mild (<39% by doppler 10/24/17 at [facility])  . Depression   . Gout   . Granulomatous lung disease ([place])   . Hearing loss    bilateral  . Hyperglycemia   . Hypersomnia   . Hypertension    benign  . Left atrial enlargement   . Mixed hyperlipidemia   . MR (mitral regurgitation)   . Obesity   . Osteoarthritis, knee    bilateral  . Pulmonary nodules   . Recurrent vertigo   . Rotator cuff arthropathy   . Tendonitis    of elbow or forearm  . Thyroid nodule   . Vitamin D deficiency     Past Surgical History:  Procedure Laterality Date  . APPENDECTOMY    . MELANOMA EXCISION     ON BACK    There were no vitals filed for this visit.   Subjective Assessment - 02/25/20 1103    Subjective Notes that she saw her MD who is trying to get approval for a gel shot for her knee. Patient reports 50% improvement sinc einitial  eval. Notes decreased pain and "moving better."    Pertinent History RTC arthropathy, recurrent vertigo, B knee OA, HLD, HTN, hyperglycemia, B hearing loss, gout, depression, a-fib, anxiety, anemia    Diagnostic tests none recent    Patient Stated Goals decrease pain    Currently in Pain? Yes    Pain Score 2     Pain Location Knee    Pain Orientation Left    Pain Descriptors / Indicators Aching    Pain Type Chronic pain              [facility] PT Assessment - 02/25/20 0001      Assessment   Medical Diagnosis Primary OA of B knees    Referring Provider (PT) Clearance Coots, MD    Onset Date/Surgical Date --   10 years     AROM   Left Knee Extension 5    Left Knee Flexion 115      PROM   Left Knee Extension 0    Left Knee Flexion 117      Strength   Right Hip Flexion 4+/5    Right Hip ABduction 4+/5    Right  Hip ADduction 4+/5    Left Hip Flexion 4+/5    Left Hip ABduction 4/5    Left Hip ADduction 4+/5    Right Knee Flexion 4+/5    Right Knee Extension 4/5    Left Knee Flexion 4+/5    Left Knee Extension 4+/5    Right Ankle Dorsiflexion 4+/5    Right Ankle Plantar Flexion 4+/5    Left Ankle Dorsiflexion 4+/5    Left Ankle Plantar Flexion 4/5      Standardized Balance Assessment   Standardized Balance Assessment Five Times Sit to Stand;Berg Balance Test    Five times sit to stand comments  --   20.65 sec with B armrests; 22.78 sec without UEs     Berg Balance Test   Sit to Stand Able to stand without using hands and stabilize independently    Standing Unsupported Able to stand safely 2 minutes    Sitting with Back Unsupported but Feet Supported on Floor or Stool Able to sit safely and securely 2 minutes    Stand to Sit Sits safely with minimal use of hands    Transfers Able to transfer safely, minor use of hands    Standing Unsupported with Eyes Closed Able to stand 10 seconds with supervision    Standing Unsupported with Feet Together Needs help to attain position  and unable to hold for 15 seconds    From Standing, Reach Forward with Outstretched Arm Can reach forward >12 cm safely (5")    From Standing Position, Pick up Object from Floor Able to pick up shoe safely and easily    From Standing Position, Turn to Look Behind Over each Shoulder Looks behind one side only/other side shows less weight shift    Turn 360 Degrees Able to turn 360 degrees safely but slowly    Standing Unsupported, Alternately Place Feet on Step/Stool Needs assistance to keep from falling or unable to try    Standing Unsupported, One Foot in [employer] balance while stepping or standing    Standing on One Leg Unable to try or needs assist to prevent fall    Total Score 35                         [facility] Adult PT Treatment/Exercise - 02/25/20 0001      Knee/Hip Exercises: Stretches   Hip Flexor Stretch Left;2 reps;30 seconds    Hip Flexor Stretch Limitations mod thomas with strap      Knee/Hip Exercises: Aerobic   Nustep L4 x 6 min (UEs/LEs)                  PT Education - 02/25/20 1153    Education Details update to HEP; discussion on objective progress and remaining impairments    Person(s) Educated Patient    Methods Explanation;Demonstration;Tactile cues;Verbal cues;Handout    Comprehension Verbalized understanding;Returned demonstration            PT Short Term Goals - 02/25/20 1108      PT SHORT TERM GOAL #1   Title Patient to be independent with initial HEP.    Time 2    Period Weeks    Status Achieved    Target Date 01/29/20             PT Long Term Goals - 02/25/20 1108      PT LONG TERM GOAL #1   Title Patient to be independent with advanced HEP.  Time 6    Period Weeks    Status Partially Met   met for current   Target Date 04/07/20      PT LONG TERM GOAL #2   Title Patient to demonstrate L knee AROM symmetrical to opposite LE and without pain limiting.    Time 6    Period Weeks    Status On-going   still  limited in L knee flexion/extension   Target Date 04/07/20      PT LONG TERM GOAL #3   Title Patient to demonstrate B LE strength >/=4+/5.    Time 6    Period Weeks    Status On-going   limited in L hip abduction and R knee flexion   Target Date 04/07/20      PT LONG TERM GOAL #4   Title Patient to demonstrate 5xSTS in <15 seconds in order to decrease fall risk.    Time 6    Period Weeks    Status On-going   20.65 sec with B armrests; 22.78 sec without UEs   Target Date 04/07/20      PT LONG TERM GOAL #5   Title Patient to report tolerance for L sidelying d/t improved pain.    Time 6    Period Weeks    Status Partially Met   reports 75% improvement     Additional Long Term Goals   Additional Long Term Goals Yes      PT LONG TERM GOAL #6   Title Patient to score >45/56 on Berg in order to decrease risk of falls.    Time 6    Period Weeks    Status New    Target Date 04/07/20      PT LONG TERM GOAL #7   Title Patient to demonstrate floor transfer with mod I.    Time 6    Period Weeks    Status New    Target Date 04/07/20                 Plan - 02/25/20 1154    Clinical Impression Statement Patient reports that she recently was seen by her MD who recommends a gel injection to her knee. Patient reports 50% improvement since initial eval. Noting improvements in pain and ability to "move around." Patient now noting 75% improvement in tolerance for L sidelying d/t decreased pain. Strength testing revealed improvement in R hip flexion and abduction, L knee extension, and B ankle plantarflexion. Slight decline in knee flexion AROM/PROM was demonstrated today, as patient also reporting increased sensation of stiffness in the L knee. Patient has demonstrated good improvement in ability to perform transfers, with progress in 5xSTS speed and ability to perform without UEs today. Patient expressed fear of falling, instability, and trouble getting up from the floor, thus assessed  Berg. Patient scored 35/56 on Berg, indicating an increased risk of falls and with most difficulty performing tasks with narrow BOS. Patient reported understanding of Romberg stance balance exercise and without complaints at end of session. Patient is demonstrating good improvement in strength and functional activity tolerance; would benefit from continued skilled PT services 1x/week for 6 weeks to address remaining goals and balance.    Comorbidities RTC arthropathy, recurrent vertigo, B knee OA, HLD, HTN, hyperglycemia, B hearing loss, gout, depression, a-fib, anxiety, anemia    Rehab Potential Good    PT Frequency 1x / week    PT Duration 6 weeks    PT Treatment/Interventions ADLs/Self Care Home Management;Cryotherapy;Dealer  Stimulation;Iontophoresis 52m/ml Dexamethasone;Moist Heat;Balance training;Therapeutic exercise;Therapeutic activities;Functional mobility training;Stair training;Gait training;DME Instruction;Ultrasound;Neuromuscular re-education;Patient/family education;Manual techniques;Vasopneumatic Device;Taping;Energy conservation;Dry needling;Passive range of motion    PT Next Visit Plan trial floor transfer, static and dynamic balance activities with narrow BOS, L hip abduction and R knee flexion strength    Consulted and Agree with Plan of Care Patient           Patient will benefit from skilled therapeutic intervention in order to improve the following deficits and impairments:  Abnormal gait, Decreased endurance, Hypomobility, Increased edema, Decreased activity tolerance, Decreased strength, Pain, Increased fascial restricitons, Increased muscle spasms, Difficulty walking, Decreased balance, Improper body mechanics, Decreased range of motion, Postural dysfunction, Impaired flexibility  Visit Diagnosis: Chronic pain of left knee  Stiffness of left knee, not elsewhere classified  Unsteadiness on feet  Difficulty in walking, not elsewhere classified  Muscle weakness  (generalized)     Problem List Patient Active Problem List   Diagnosis Date Noted  . Perennial allergic rhinitis with predominant nonallergic component 07/29/2019  . Chronic sinusitis/eustachian tube dysfunction 07/29/2019  . Eustachian tube dysfunction, bilateral 07/29/2019  . Dizziness 07/29/2019  . Low back pain 11/17/2016  . Lymphedema of both lower extremities 07/12/2016  . OA (osteoarthritis) of knee 08/11/2014  . Rotator cuff syndrome of left shoulder 08/11/2014     YJanene Harvey PT, DPT 02/25/20 12:07 PM   [facility] [address] [place] [address]Phone: [phone]  Fax:  [phone] Name: Jennifer BOOTMRN: [id number] of Birth: [birth year]

## 2020-03-03 ENCOUNTER — Encounter: Payer: Self-pay | Admitting: Family Medicine

## 2020-03-03 ENCOUNTER — Telehealth: Payer: Self-pay | Admitting: Family Medicine

## 2020-03-03 NOTE — Telephone Encounter (Signed)
Faxed [employer] Rx form application to [phone] -glh

## 2020-03-04 ENCOUNTER — Ambulatory Visit: Payer: MEDICARE

## 2020-03-04 ENCOUNTER — Other Ambulatory Visit: Payer: Self-pay

## 2020-03-04 DIAGNOSIS — G8929 Other chronic pain: Secondary | ICD-10-CM

## 2020-03-04 DIAGNOSIS — M25662 Stiffness of left knee, not elsewhere classified: Secondary | ICD-10-CM

## 2020-03-04 DIAGNOSIS — R262 Difficulty in walking, not elsewhere classified: Secondary | ICD-10-CM

## 2020-03-04 DIAGNOSIS — M25562 Pain in left knee: Secondary | ICD-10-CM | POA: Diagnosis not present

## 2020-03-04 DIAGNOSIS — M6281 Muscle weakness (generalized): Secondary | ICD-10-CM

## 2020-03-04 DIAGNOSIS — R2681 Unsteadiness on feet: Secondary | ICD-10-CM

## 2020-03-04 NOTE — Therapy (Signed)
[facility] [address]  [place], [place], [address] Phone: [phone]   Fax:  [phone]  Physical Therapy Treatment  Patient Details  Name: Jennifer Hardin MRN: [id number] Date of Birth: Jul 05, 1940 Referring Provider (PT): Clearance Coots, MD   Encounter Date: 03/04/2020   PT End of Session - 03/04/20 1335    Visit Number 11    Number of Visits 16    Date for PT Re-Evaluation 04/07/20    Authorization Type Medicare & Federal BCBS    PT Start Time 5038   Pt. arrived late   PT Stop Time 1400    PT Time Calculation (min) 32 min    Equipment Utilized During Treatment Gait belt    Activity Tolerance Patient tolerated treatment well    Behavior During Therapy WFL for tasks assessed/performed           Past Medical History:  Diagnosis Date  . Allergic rhinitis   . Anemia   . Anxiety   . Asymptomatic varicose veins   . Atrial fibrillation ([place])   . Carotid stenosis    very mild (<39% by doppler 10/24/17 at [facility])  . Depression   . Gout   . 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 Hearing loss    bilateral  . Hyperglycemia   . Hypersomnia   . Hypertension    benign  . Left atrial enlargement   . Mixed hyperlipidemia   . MR (mitral regurgitation)   . Obesity   . Osteoarthritis, knee    bilateral  . Pulmonary nodules   . Recurrent vertigo   . Rotator cuff arthropathy   . Tendonitis    of elbow or forearm  . Thyroid nodule   . Vitamin D deficiency     Past Surgical History:  Procedure Laterality Date  . APPENDECTOMY    . MELANOMA EXCISION     ON BACK    There were no vitals filed for this visit.   Subjective Assessment - 03/04/20 1330    Subjective Pt. doing ok.  She is checking on insurance coverage for gel injections for knees.    Pertinent History RTC arthropathy, recurrent vertigo, B knee OA, HLD, HTN, hyperglycemia, B hearing loss, gout, depression, a-fib, anxiety, anemia    Diagnostic  tests none recent    Patient Stated Goals decrease pain    Currently in Pain? No/denies    Pain Score 0-No pain    Pain Location Knee    Pain Orientation Right;Left                             [facility] Adult PT Treatment/Exercise - 03/04/20 0001      Knee/Hip Exercises: Aerobic   Nustep L4 x 6 min (UEs/LEs)      Knee/Hip Exercises: Machines for Strengthening   Cybex Leg Press B LEs: 35# x 20 reps      Knee/Hip Exercises: Standing   Heel Raises Both;20 reps    Heel Raises Limitations heel raise     Knee Flexion Right;Left;10 reps    Knee Flexion Limitations 2#    Hip Flexion Right;Left;10 reps;Knee bent;Stengthening    Hip Flexion Limitations 2#      Knee/Hip Exercises: Seated   Long Arc Quad Right;Left;15 reps    Long Arc Quad Weight 2 lbs.    Long [employer] Limitations TKE  PT Short Term Goals - 02/25/20 1108      PT SHORT TERM GOAL #1   Title Patient to be independent with initial HEP.    Time 2    Period Weeks    Status Achieved    Target Date 01/29/20             PT Long Term Goals - 02/25/20 1108      PT LONG TERM GOAL #1   Title Patient to be independent with advanced HEP.    Time 6    Period Weeks    Status Partially Met   met for current   Target Date 04/07/20      PT LONG TERM GOAL #2   Title Patient to demonstrate L knee AROM symmetrical to opposite LE and without pain limiting.    Time 6    Period Weeks    Status On-going   still limited in L knee flexion/extension   Target Date 04/07/20      PT LONG TERM GOAL #3   Title Patient to demonstrate B LE strength >/=4+/5.    Time 6    Period Weeks    Status On-going   limited in L hip abduction and R knee flexion   Target Date 04/07/20      PT LONG TERM GOAL #4   Title Patient to demonstrate 5xSTS in <15 seconds in order to decrease fall risk.    Time 6    Period Weeks    Status On-going   20.65 sec with B armrests; 22.78 sec without UEs   Target  Date 04/07/20      PT LONG TERM GOAL #5   Title Patient to report tolerance for L sidelying d/t improved pain.    Time 6    Period Weeks    Status Partially Met   reports 75% improvement     Additional Long Term Goals   Additional Long Term Goals Yes      PT LONG TERM GOAL #6   Title Patient to score >45/56 on Berg in order to decrease risk of falls.    Time 6    Period Weeks    Status New    Target Date 04/07/20      PT LONG TERM GOAL #7   Title Patient to demonstrate floor transfer with mod I.    Time 6    Period Weeks    Status New    Target Date 04/07/20                 Plan - 03/04/20 1424    Clinical Impression Statement Pt. arrived late to session thus treatment time limited.  Progressed repetitions with all LE strengthening along with initiation of machine leg press 35# x 20 without pain.  Pt. continues to be very agreeable with therex and denies recent falls.    Comorbidities RTC arthropathy, recurrent vertigo, B knee OA, HLD, HTN, hyperglycemia, B hearing loss, gout, depression, a-fib, anxiety, anemia    Rehab Potential Good    PT Frequency 1x / week    PT Duration 6 weeks    PT Treatment/Interventions ADLs/Self Care Home Management;Cryotherapy;Electrical Stimulation;Iontophoresis 4mg /ml Dexamethasone;Moist Heat;Balance training;Therapeutic exercise;Therapeutic activities;Functional mobility training;Stair training;Gait training;DME Instruction;Ultrasound;Neuromuscular re-education;Patient/family education;Manual techniques;Vasopneumatic Device;Taping;Energy conservation;Dry needling;Passive range of motion    PT Next Visit Plan trial floor transfer, static and dynamic balance activities with narrow BOS, L hip abduction and R knee flexion strength    Consulted and Agree with Plan of  Care Patient           Patient will benefit from skilled therapeutic intervention in order to improve the following deficits and impairments:  Abnormal gait,Decreased  endurance,Hypomobility,Increased edema,Decreased activity tolerance,Decreased strength,Pain,Increased fascial restricitons,Increased muscle spasms,Difficulty walking,Decreased balance,Improper body mechanics,Decreased range of motion,Postural dysfunction,Impaired flexibility  Visit Diagnosis: Chronic pain of left knee  Stiffness of left knee, not elsewhere classified  Unsteadiness on feet  Difficulty in walking, not elsewhere classified  Muscle weakness (generalized)     Problem List Patient Active Problem List   Diagnosis Date Noted  . Perennial allergic rhinitis with predominant nonallergic component 07/29/2019  . Chronic sinusitis/eustachian tube dysfunction 07/29/2019  . Eustachian tube dysfunction, bilateral 07/29/2019  . Dizziness 07/29/2019  . Low back pain 11/17/2016  . Lymphedema of both lower extremities 07/12/2016  . OA (osteoarthritis) of knee 08/11/2014  . Rotator cuff syndrome of left shoulder 08/11/2014    Bess Harvest, PTA 03/04/20 2:26 PM   [facility] [address]  [place], [place], [address] Phone: [phone]   Fax:  [phone]  Name: LUNNA VOGELGESANG MRN: [id number] Date of Birth: 1940/08/27

## 2020-03-10 ENCOUNTER — Encounter: Payer: Self-pay | Admitting: Physical Therapy

## 2020-03-10 ENCOUNTER — Ambulatory Visit: Payer: MEDICARE | Admitting: Physical Therapy

## 2020-03-10 ENCOUNTER — Other Ambulatory Visit: Payer: Self-pay

## 2020-03-10 DIAGNOSIS — M25562 Pain in left knee: Secondary | ICD-10-CM

## 2020-03-10 DIAGNOSIS — M25662 Stiffness of left knee, not elsewhere classified: Secondary | ICD-10-CM

## 2020-03-10 DIAGNOSIS — R2681 Unsteadiness on feet: Secondary | ICD-10-CM

## 2020-03-10 DIAGNOSIS — M6281 Muscle weakness (generalized): Secondary | ICD-10-CM

## 2020-03-10 DIAGNOSIS — R262 Difficulty in walking, not elsewhere classified: Secondary | ICD-10-CM

## 2020-03-10 NOTE — Therapy (Addendum)
[facility] [address]  [place], [place], [address] Phone: [phone]   Fax:  [phone]  Physical Therapy Treatment  Patient Details  Name: Jennifer Hardin MRN: [id number] Date of Birth: Sep 03, 1940 Referring Provider (PT): Clearance Coots, MD  Progress Note Reporting Period 03/04/20 to 03/10/20  See note below for Objective Data and Assessment of Progress/Goals.     Encounter Date: 03/10/2020   PT End of Session - 03/10/20 1530    Visit Number 12    Number of Visits 16    Date for PT Re-Evaluation 04/07/20    Authorization Type Medicare & Federal BCBS    PT Start Time 7564    PT Stop Time 1529    PT Time Calculation (min) 42 min    Equipment Utilized During Treatment Gait belt    Activity Tolerance Patient tolerated treatment well    Behavior During Therapy WFL for tasks assessed/performed           Past Medical History:  Diagnosis Date  . Allergic rhinitis   . Anemia   . Anxiety   . Asymptomatic varicose veins   . Atrial fibrillation ([place])   . Carotid stenosis    very mild (<39% by doppler 10/24/17 at [facility])  . Depression   . Gout   . 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 Hearing loss    bilateral  . Hyperglycemia   . Hypersomnia   . Hypertension    benign  . Left atrial enlargement   . Mixed hyperlipidemia   . MR (mitral regurgitation)   . Obesity   . Osteoarthritis, knee    bilateral  . Pulmonary nodules   . Recurrent vertigo   . Rotator cuff arthropathy   . Tendonitis    of elbow or forearm  . Thyroid nodule   . Vitamin D deficiency     Past Surgical History:  Procedure Laterality Date  . APPENDECTOMY    . MELANOMA EXCISION     ON BACK    There were no vitals filed for this visit.   Subjective Assessment - 03/10/20 1448    Subjective Her allergies are acting up today and knees were sore this AM from the weather.    Pertinent History RTC arthropathy, recurrent vertigo,  B knee OA, HLD, HTN, hyperglycemia, B hearing loss, gout, depression, a-fib, anxiety, anemia    Diagnostic tests none recent    Patient Stated Goals decrease pain    Currently in Pain? No/denies                             [facility] Adult PT Treatment/Exercise - 03/10/20 0001      Neuro Re-ed    Neuro Re-ed Details  single toe tap on dynadisc 10x each foot without UE support; grapevine with intermittent hands hovering over counter- better balance with 1 UE support   unable to perfom 3x toe tap     Knee/Hip Exercises: Stretches   Passive Hamstring Stretch Right;Left;30 seconds;2 reps    Passive Hamstring Stretch Limitations supine with strap + ankle DF    Hip Flexor Stretch Left;2 reps;30 seconds    Hip Flexor Stretch Limitations mod thomas with strap      Knee/Hip Exercises: Aerobic   Nustep L5 x 6 min (UEs/LEs)      Knee/Hip Exercises: Standing   Hip Abduction Stengthening;Right;Left;1 set;10 reps;Knee straight    Abduction Limitations 2#  cues to maintain chest upright   Hip Extension Stengthening;Right;Left;1 set;10 reps;Knee straight    Extension Limitations cues to maintain knee straight                  PT Education - 03/10/20 1529    Education Details update to HEP    Person(s) Educated Patient    Methods Explanation;Demonstration;Tactile cues;Verbal cues;Handout    Comprehension Returned demonstration;Verbalized understanding            PT Short Term Goals - 02/25/20 1108      PT SHORT TERM GOAL #1   Title Patient to be independent with initial HEP.    Time 2    Period Weeks    Status Achieved    Target Date 01/29/20             PT Long Term Goals - 03/10/20 1531      PT LONG TERM GOAL #1   Title Patient to be independent with advanced HEP.    Time 6    Period Weeks    Status Partially Met   met for current     PT LONG TERM GOAL #2   Title Patient to demonstrate L knee AROM symmetrical to opposite LE and without pain  limiting.    Time 6    Period Weeks    Status On-going   still limited in L knee flexion/extension     PT LONG TERM GOAL #3   Title Patient to demonstrate B LE strength >/=4+/5.    Time 6    Period Weeks    Status On-going   limited in L hip abduction and R knee flexion     PT LONG TERM GOAL #4   Title Patient to demonstrate 5xSTS in <15 seconds in order to decrease fall risk.    Time 6    Period Weeks    Status On-going   20.65 sec with B armrests; 22.78 sec without UEs     PT LONG TERM GOAL #5   Title Patient to report tolerance for L sidelying d/t improved pain.    Time 6    Period Weeks    Status Partially Met   reports 75% improvement     PT LONG TERM GOAL #6   Title Patient to score >45/56 on Berg in order to decrease risk of falls.    Time 6    Period Weeks    Status On-going      PT LONG TERM GOAL #7   Title Patient to demonstrate floor transfer with mod I.    Time 6    Period Weeks    Status On-going                 Plan - 03/10/20 1530    Clinical Impression Statement Patient reporting increased soreness in B knees this AM d/t the weather. Offered to trial floor transfers today, however she requested to defer to next session. Proceeded with LE stretching with patient noting most stretch with Modified Thomas. Patient performed standing hip strengthening with added weighted resistance with cueing to maintain upright trunk. Slightly more compensatory L trunk lean demonstrated with R vs. L hip abduction. Worked on dynamic single leg balance activities with patient initially hesitant to fully perform R/L weight shift onto standing leg, causing imbalance. Also demonstrated short and uneven stepping with grapevine, as well as intermittent reliance on UE support. Updated HEP with balance exercises that were safely performed today- patient reported understanding  and without complaints at end of session.    Comorbidities RTC arthropathy, recurrent vertigo, B knee OA,  HLD, HTN, hyperglycemia, B hearing loss, gout, depression, a-fib, anxiety, anemia    Rehab Potential Good    PT Frequency 1x / week    PT Duration 6 weeks    PT Treatment/Interventions ADLs/Self Care Home Management;Cryotherapy;Electrical Stimulation;Iontophoresis 11m/ml Dexamethasone;Moist Heat;Balance training;Therapeutic exercise;Therapeutic activities;Functional mobility training;Stair training;Gait training;DME Instruction;Ultrasound;Neuromuscular re-education;Patient/family education;Manual techniques;Vasopneumatic Device;Taping;Energy conservation;Dry needling;Passive range of motion    PT Next Visit Plan trial floor transfer, static and dynamic balance activities with narrow BOS, L hip abduction and R knee flexion strength    Consulted and Agree with Plan of Care Patient           Patient will benefit from skilled therapeutic intervention in order to improve the following deficits and impairments:  Abnormal gait,Decreased endurance,Hypomobility,Increased edema,Decreased activity tolerance,Decreased strength,Pain,Increased fascial restricitons,Increased muscle spasms,Difficulty walking,Decreased balance,Improper body mechanics,Decreased range of motion,Postural dysfunction,Impaired flexibility  Visit Diagnosis: Chronic pain of left knee  Stiffness of left knee, not elsewhere classified  Unsteadiness on feet  Difficulty in walking, not elsewhere classified  Muscle weakness (generalized)     Problem List Patient Active Problem List   Diagnosis Date Noted  . Perennial allergic rhinitis with predominant nonallergic component 07/29/2019  . Chronic sinusitis/eustachian tube dysfunction 07/29/2019  . Eustachian tube dysfunction, bilateral 07/29/2019  . Dizziness 07/29/2019  . Low back pain 11/17/2016  . Lymphedema of both lower extremities 07/12/2016  . OA (osteoarthritis) of knee 08/11/2014  . Rotator cuff syndrome of left shoulder 08/11/2014     YJanene Harvey PT,  DPT 03/10/20 3:33 PM   [facility] [address] [place] [address]: [phone]  Fax:  [phone] Name: Jennifer KROEZEMRN: [id number] of Birth: [birth year]  PHYSICAL THERAPY DISCHARGE SUMMARY  Visits from Start of Care: 12  Current functional level related to goals / functional outcomes: Unable to assess; patient requested to discontinue with therapy after her gel injections   Remaining deficits: Unable to assess   Education / Equipment: HEP  Plan: Patient agrees to discharge.  Patient goals were partially met. Patient is being discharged due to the patient's request.  ?????     YJanene Harvey PT, DPT 04/09/20 2:21 PM

## 2020-03-17 ENCOUNTER — Ambulatory Visit: Payer: MEDICARE

## 2020-03-23 ENCOUNTER — Ambulatory Visit: Payer: MEDICARE | Admitting: Physical Therapy

## 2020-03-25 ENCOUNTER — Other Ambulatory Visit: Payer: Self-pay

## 2020-03-25 ENCOUNTER — Ambulatory Visit: Payer: Self-pay

## 2020-03-25 ENCOUNTER — Ambulatory Visit (INDEPENDENT_AMBULATORY_CARE_PROVIDER_SITE_OTHER): Payer: MEDICARE | Admitting: Family Medicine

## 2020-03-25 DIAGNOSIS — M170 Bilateral primary osteoarthritis of knee: Secondary | ICD-10-CM

## 2020-03-25 NOTE — Patient Instructions (Signed)
Good to see you  Please use ice as needed  Please send me a message in MyChart with any questions or updates.  Please see me back in 4 weeks.   --Dr. Marque Rademaker  

## 2020-03-25 NOTE — Progress Notes (Signed)
Jennifer Hardin - 80 y.o. female MRN [id number]  Date of birth: 1940-09-01  SUBJECTIVE:  Including CC & ROS.  No chief complaint on file.   Jennifer Hardin is a 80 y.o. female that is presenting for her gel injection.   Review of Systems See HPI   HISTORY: Past Medical, Surgical, Social, and Family History Reviewed & Updated per EMR.   Pertinent Historical Findings include:  Past Medical History:  Diagnosis Date  . Allergic rhinitis   . Anemia   . Anxiety   . Asymptomatic varicose veins   . Atrial fibrillation ([facility])   . Carotid stenosis    very mild (<39% by doppler 10/24/17 at [facility])  . Depression   . Gout   . Granulomatous lung disease ([facility])   . Hearing loss    bilateral  . Hyperglycemia   . Hypersomnia   . Hypertension    benign  . Left atrial enlargement   . Mixed hyperlipidemia   . MR (mitral regurgitation)   . Obesity   . Osteoarthritis, knee    bilateral  . Pulmonary nodules   . Recurrent vertigo   . Rotator cuff arthropathy   . Tendonitis    of elbow or forearm  . Thyroid nodule   . Vitamin D deficiency     Past Surgical History:  Procedure Laterality Date  . APPENDECTOMY    . MELANOMA EXCISION     ON BACK    Family History  Problem Relation Age of Onset  . Hyperlipidemia Sister   . Hypertension Brother     Social History   Socioeconomic History  . Marital status: Divorced    Spouse name: Not on file  . Number of children: Not on file  . Years of education: Not on file  . Highest education level: Not on file  Occupational History  . Not on file  Tobacco Use  . Smoking status: Never Smoker  . Smokeless tobacco: Never Used  Vaping Use  . Vaping Use: Never used  Substance and Sexual Activity  . Alcohol use: Never  . Drug use: Never  . Sexual activity: Not on file  Other Topics Concern  . Not on file  Social History Narrative   Lives in [facility]   Substitute teacher   Social Determinants of Health   Financial Resource Strain:  Not on file  Food Insecurity: Not on file  Transportation Needs: Not on file  Physical Activity: Not on file  Stress: Not on file  Social Connections: Not on file  Intimate Partner Violence: Not on file     PHYSICAL EXAM:  VS: BP 130/78   Ht 5\' 7"  (1.702 m)   Wt 195 lb (88.5 kg)   BMI 30.54 kg/m  Physical Exam Gen: NAD, alert, cooperative with exam, well-appearing   Aspiration/Injection Procedure Note Jennifer Hardin [birth year]  Procedure: Aspiration and Injection Indications: Left knee pain  Procedure Details Consent: Risks of procedure as well as the alternatives and risks of each were explained to the (patient/caregiver).  Consent for procedure obtained. Time Out: Verified patient identification, verified procedure, site/side was marked, verified correct patient position, special equipment/implants available, medications/allergies/relevent history reviewed, required imaging and test results available.  Performed.  The area was cleaned with iodine and alcohol swabs.    The left knee superior lateral suprapatellar pouch was injected using 4 cc's of 1% lidocaine with a 21 2" needle.  An 18-gauge 1-1/2 inch needle was used for aspiration.  The syringe  was switched and a 4 mL of 22 mg/mL was injected. Ultrasound was used. Images were obtained in  Long views showing the injection.    Amount of Fluid Aspirated: 72mL Character of Fluid: clear and straw colored Fluid was sent for:n/a A sterile dressing was applied.  Patient did tolerate procedure well.   ASSESSMENT & PLAN:   OA (osteoarthritis) of knee Will complete gel injection today.  - monovisc  - counseled on home exercise therapy and supportive care.  - consider imaging.

## 2020-03-25 NOTE — Assessment & Plan Note (Addendum)
Will complete gel injection today.  - monovisc  - counseled on home exercise therapy and supportive care.  - consider imaging.

## 2020-04-22 ENCOUNTER — Ambulatory Visit: Payer: MEDICARE | Admitting: Family Medicine

## 2020-08-04 IMAGING — CT CT HEAD W/O CM
3 series · 16 of 47 positions shown, 19 images · non-contrast
Comparison: No recent comparison.

CLINICAL DATA: Vertigo.

EXAM:
CT HEAD WITHOUT CONTRAST
TECHNIQUE: Contiguous axial images were obtained from the base of the skull
through the vertex without intravenous contrast.

[Series 2: head wo · axial · 0.46mm/px · z∈[-196,-56]mm · 10 of 34 slices shown, 13 images]
[im 3/34  brain]
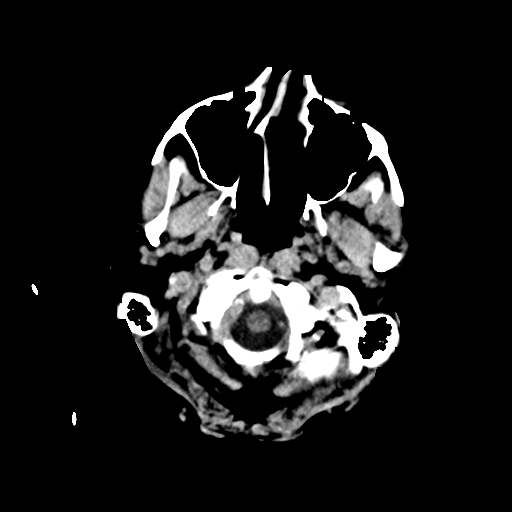
[im 3/34  bone]
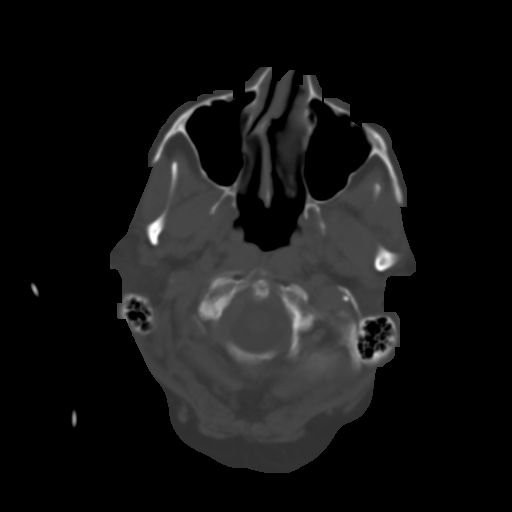
[im 6/34  brain]
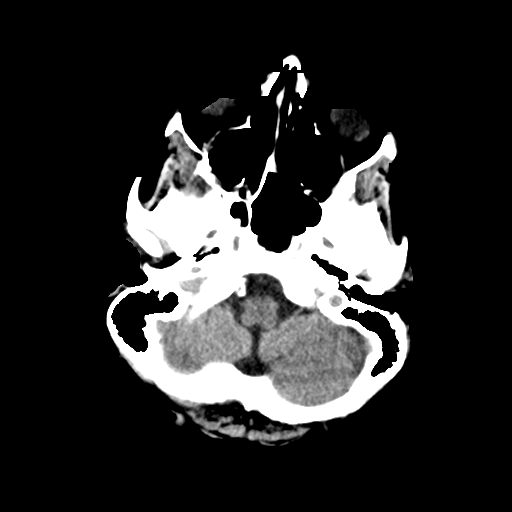
[im 10/34  brain]
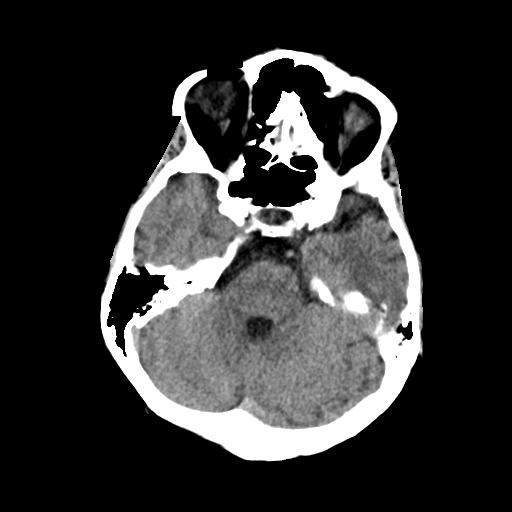
[im 12/34  brain]
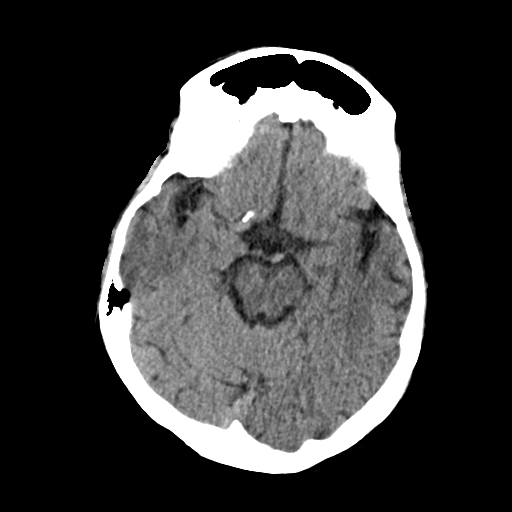
[im 15/34  brain]
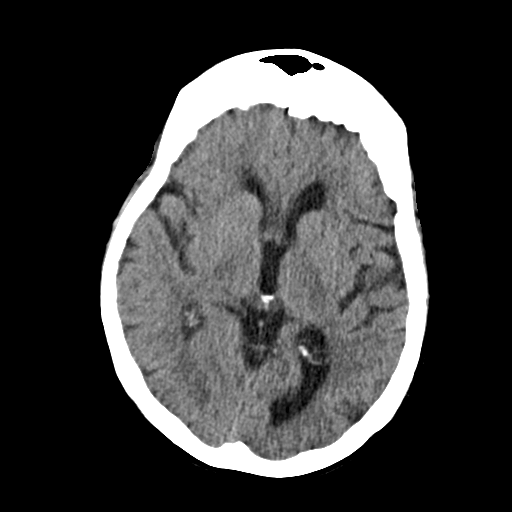
[im 15/34  bone]
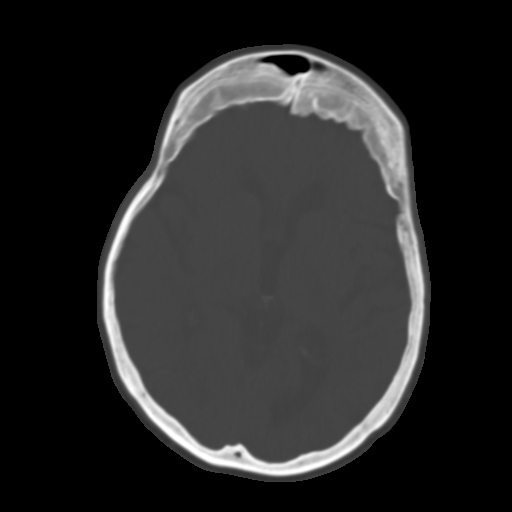
[im 19/34  brain]
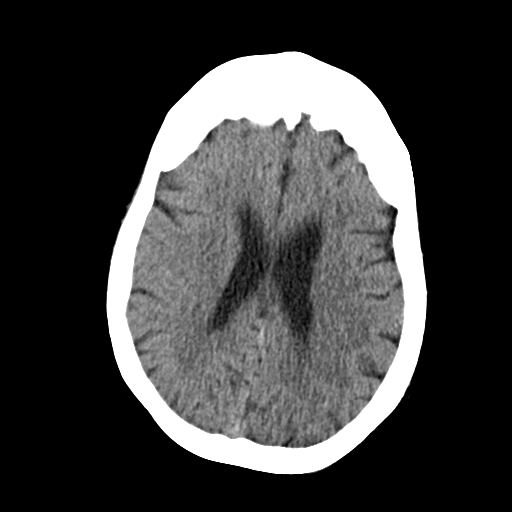
[im 22/34  brain]
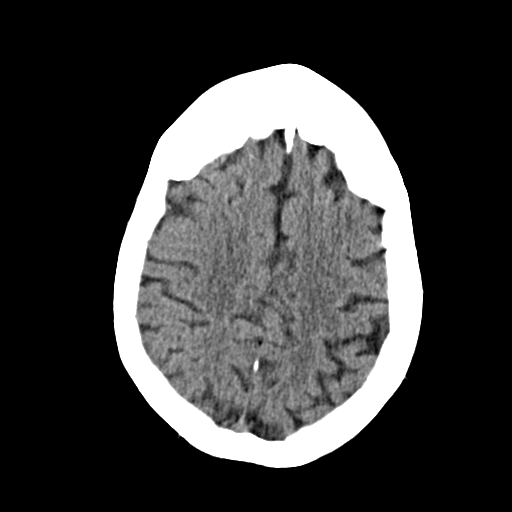
[im 26/34  brain]
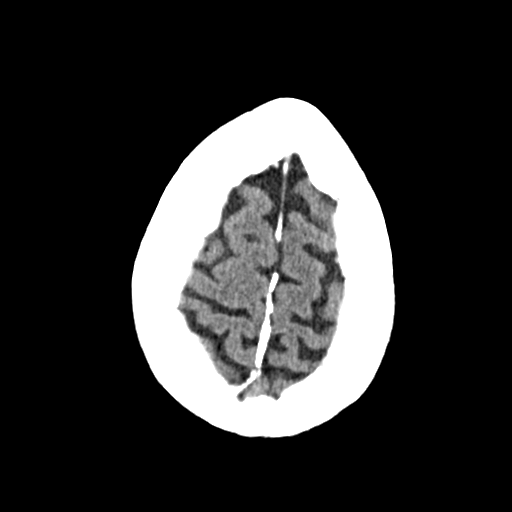
[im 28/34  brain]
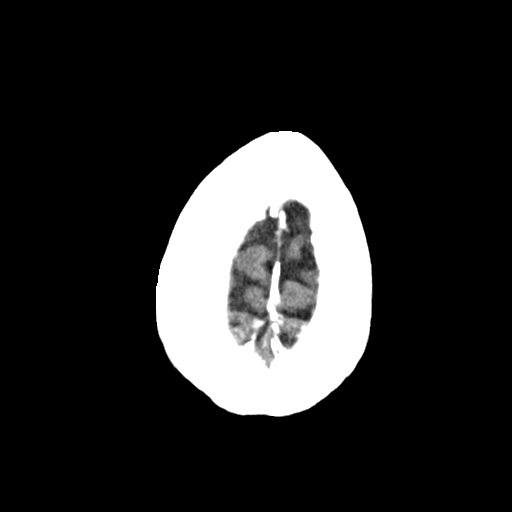
[im 28/34  bone]
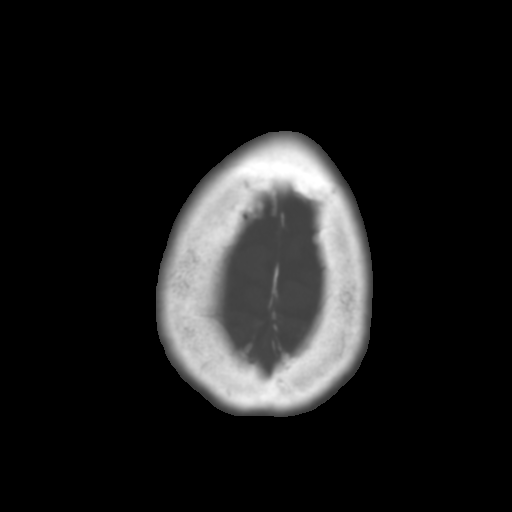
[im 31/34  brain]
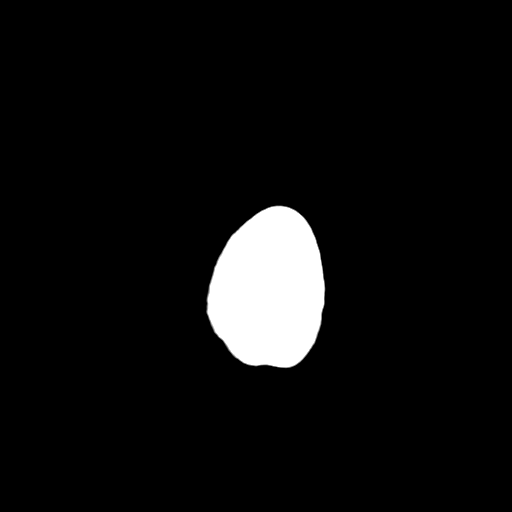

[Series 4: coronal soft · coronal · 0.33mm/px · 3 of 71 slices shown]
[im 24/71  brain]
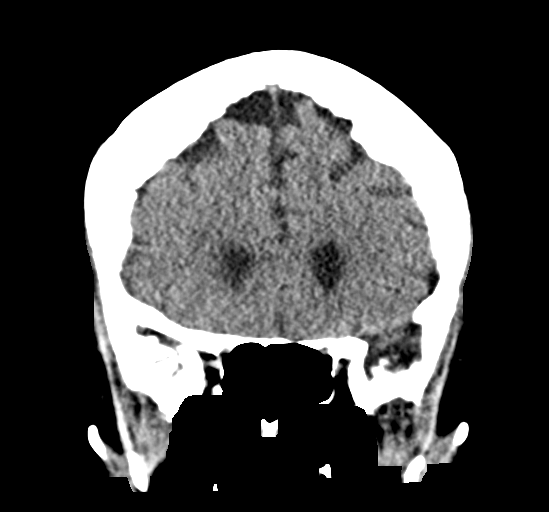
[im 32/71  brain]
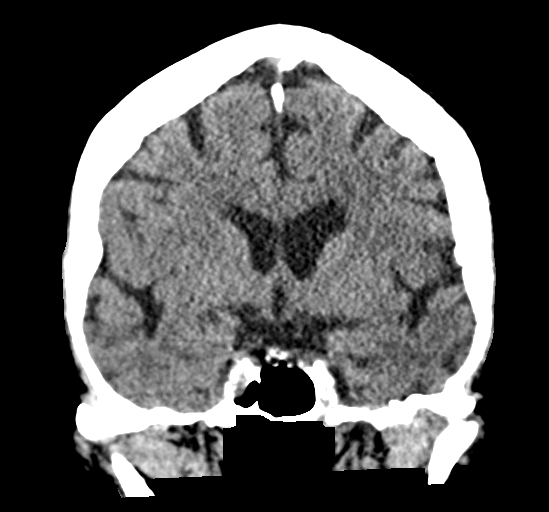
[im 39/71  brain]
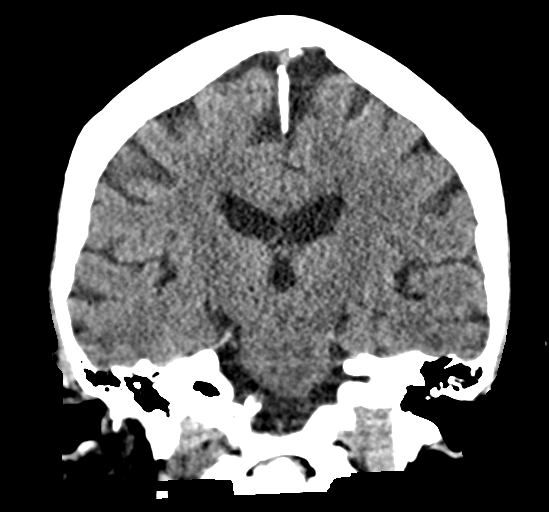

[Series 6: sag soft · sagittal · 0.33mm/px · 3 of 55 slices shown]
[im 19/55  brain]
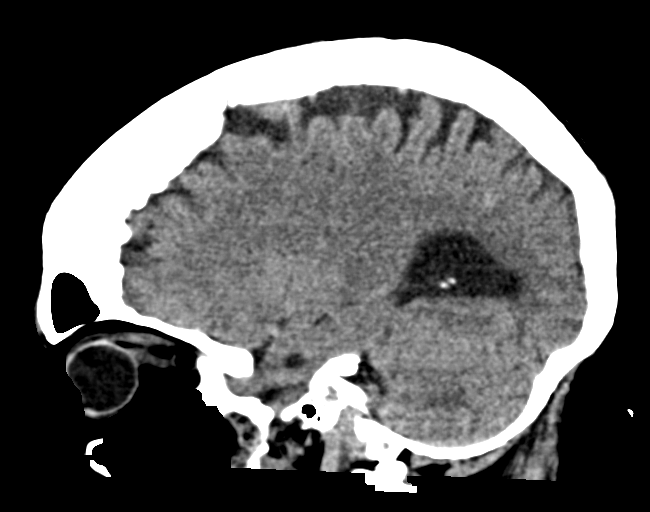
[im 28/55  brain]
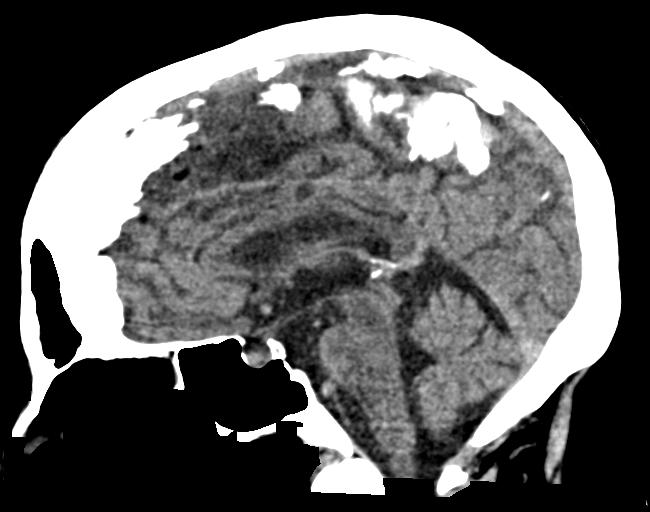
[im 37/55  brain]
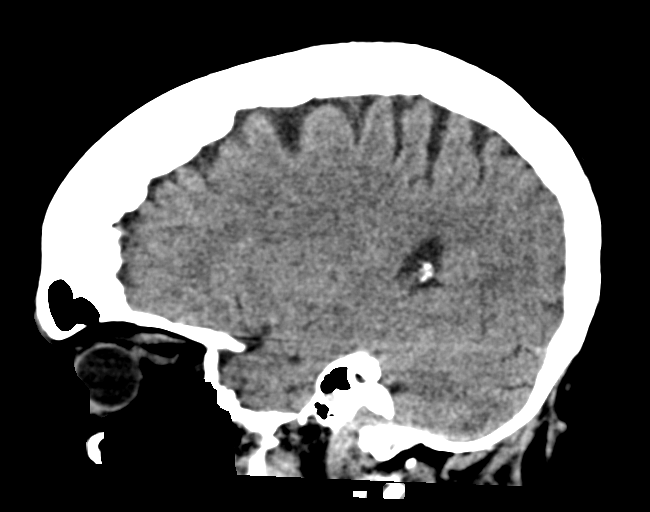

[16 of 47 positions shown; findings below may reference images not displayed]

FINDINGS: Brain: No evidence of acute infarction, hemorrhage, hydrocephalus,
extra-axial collection or mass lesion/mass effect.

Vascular: No hyperdense vessel or unexpected calcification.

Skull: Normal. Negative for fracture or focal lesion. Hyperostosis
frontalis interna with calvarial thickening of bilateral frontal
calvarium.

Sinuses/Orbits: Visualized paranasal sinuses are clear. No mastoid
effusion. Orbits are unremarkable.

Other: None.
IMPRESSION: No acute intracranial abnormality.

## 2022-01-06 ENCOUNTER — Encounter (HOSPITAL_BASED_OUTPATIENT_CLINIC_OR_DEPARTMENT_OTHER): Payer: Self-pay | Admitting: Emergency Medicine

## 2022-01-06 ENCOUNTER — Emergency Department (HOSPITAL_BASED_OUTPATIENT_CLINIC_OR_DEPARTMENT_OTHER)
Admission: EM | Admit: 2022-01-06 | Discharge: 2022-01-19 | Disposition: E | Payer: MEDICARE | Attending: Emergency Medicine | Admitting: Emergency Medicine

## 2022-01-06 ENCOUNTER — Emergency Department (HOSPITAL_BASED_OUTPATIENT_CLINIC_OR_DEPARTMENT_OTHER): Payer: MEDICARE

## 2022-01-06 DIAGNOSIS — Z7901 Long term (current) use of anticoagulants: Secondary | ICD-10-CM | POA: Insufficient documentation

## 2022-01-06 DIAGNOSIS — I4891 Unspecified atrial fibrillation: Secondary | ICD-10-CM | POA: Diagnosis not present

## 2022-01-06 DIAGNOSIS — R578 Other shock: Secondary | ICD-10-CM | POA: Insufficient documentation

## 2022-01-06 DIAGNOSIS — R1032 Left lower quadrant pain: Secondary | ICD-10-CM | POA: Diagnosis present

## 2022-01-06 DIAGNOSIS — I469 Cardiac arrest, cause unspecified: Secondary | ICD-10-CM | POA: Diagnosis not present

## 2022-01-06 DIAGNOSIS — R092 Respiratory arrest: Secondary | ICD-10-CM

## 2022-01-06 DIAGNOSIS — R799 Abnormal finding of blood chemistry, unspecified: Secondary | ICD-10-CM | POA: Diagnosis not present

## 2022-01-06 DIAGNOSIS — I713 Abdominal aortic aneurysm, ruptured, unspecified: Secondary | ICD-10-CM | POA: Diagnosis not present

## 2022-01-06 DIAGNOSIS — Z7982 Long term (current) use of aspirin: Secondary | ICD-10-CM | POA: Diagnosis not present

## 2022-01-06 LAB — OCCULT BLOOD X 1 CARD TO LAB, STOOL: Fecal Occult Bld: NEGATIVE

## 2022-01-06 LAB — CBG MONITORING, ED: Glucose-Capillary: 177 mg/dL — ABNORMAL HIGH (ref 70–99)

## 2022-01-06 MED ORDER — FENTANYL 2500MCG IN NS 250ML (10MCG/ML) PREMIX INFUSION
INTRAVENOUS | Status: AC
  Filled 2022-01-06: qty 250

## 2022-01-06 MED ORDER — SODIUM BICARBONATE 8.4 % IV SOLN
INTRAVENOUS | Status: DC
  Filled 2022-01-06: qty 1000

## 2022-01-06 MED ORDER — SODIUM CHLORIDE 0.9% IV SOLUTION: Freq: Once | INTRAVENOUS | Status: DC

## 2022-01-06 MED ORDER — FENTANYL 2500MCG IN NS 250ML (10MCG/ML) PREMIX INFUSION
0.0000 ug/h | INTRAVENOUS | Status: DC
  Administered 2022-01-07: 25 ug/h via INTRAVENOUS

## 2022-01-06 MED ORDER — EPINEPHRINE HCL 5 MG/250ML IV SOLN IN NS
0.5000 ug/min | INTRAVENOUS | Status: DC
  Administered 2022-01-06: 0.5 ug/min via INTRAVENOUS

## 2022-01-06 MED ORDER — IOHEXOL 350 MG/ML SOLN
100.0000 mL | Freq: Once | INTRAVENOUS | Status: AC | PRN
  Administered 2022-01-07: 100 mL via INTRAVENOUS

## 2022-01-06 MED ORDER — LACTATED RINGERS IV BOLUS
500.0000 mL | Freq: Once | INTRAVENOUS | Status: AC
  Administered 2022-01-06: 500 mL via INTRAVENOUS

## 2022-01-06 MED ORDER — EPINEPHRINE 1 MG/10ML IJ SOSY
PREFILLED_SYRINGE | INTRAMUSCULAR | Status: AC | PRN
  Administered 2022-01-06 (×2): 1 mg via INTRAVENOUS

## 2022-01-06 MED ORDER — SODIUM BICARBONATE 8.4 % IV SOLN
INTRAVENOUS | Status: AC
  Filled 2022-01-06: qty 100

## 2022-01-06 MED ORDER — EPINEPHRINE HCL 5 MG/250ML IV SOLN IN NS
INTRAVENOUS | Status: AC
  Filled 2022-01-06: qty 250

## 2022-01-06 MED ORDER — HALOPERIDOL LACTATE 5 MG/ML IJ SOLN: 2.0000 mg | Freq: Once | INTRAMUSCULAR | Status: DC

## 2022-01-06 MED ORDER — EPINEPHRINE 1 MG/10ML IJ SOSY
PREFILLED_SYRINGE | INTRAMUSCULAR | Status: AC | PRN
  Administered 2022-01-06: 1 mg via INTRAVENOUS

## 2022-01-06 MED ORDER — DICYCLOMINE HCL 10 MG/ML IM SOLN: 20.0000 mg | Freq: Once | INTRAMUSCULAR | Status: DC

## 2022-01-06 NOTE — Code Documentation (Addendum)
Son now at bedside advising pt is full code

## 2022-01-06 NOTE — Code Documentation (Addendum)
SPONTANEOUS BREATHING PER RT - Weak femoral pulse per provider Dr. Randal Buba ; compressions still held

## 2022-01-06 NOTE — Code Documentation (Signed)
PULSE CHECK -- ATTEMPTING TO APPLY LUCAS AT THIS TIME

## 2022-01-06 NOTE — ED Notes (Signed)
Unable to obtain vital signs or EKG. PT will not stay still and is extremely restless. Charge informed

## 2022-01-06 NOTE — ED Triage Notes (Signed)
Pt brought in by EMS for c/o severe abd pain  Pt states she started having pain in her left lower quadrant a couple hours ago  States the pain radiates down to her left groin area to her perineal area  Pt reports she went to the bathroom and had a large green bowel movement  Pt reports she has also had nausea and vomiting  EMS started an IV in her left forearm with a #20, started an infusion of LR and gave Zofran 4mg  IV  Pt moaning during triage

## 2022-01-06 NOTE — ED Notes (Signed)
Once again atempted vitals and EKG, Unable to obtain at this time

## 2022-01-06 NOTE — Code Documentation (Signed)
Verbal order to transfuse 2 units emergency release blood

## 2022-01-06 NOTE — Code Documentation (Signed)
AMP BICARB

## 2022-01-07 ENCOUNTER — Emergency Department (HOSPITAL_BASED_OUTPATIENT_CLINIC_OR_DEPARTMENT_OTHER): Payer: MEDICARE

## 2022-01-07 DIAGNOSIS — I713 Abdominal aortic aneurysm, ruptured, unspecified: Secondary | ICD-10-CM | POA: Diagnosis not present

## 2022-01-07 LAB — TYPE AND SCREEN
Unit division: 0
Unit division: 0

## 2022-01-07 LAB — BPAM RBC
Blood Product Expiration Date: 202311012359
Blood Product Expiration Date: 202311012359
ISSUE DATE / TIME: 202310192339
ISSUE DATE / TIME: 202310192339
Unit Type and Rh: 9500
Unit Type and Rh: 9500

## 2022-01-07 LAB — CBC WITH DIFFERENTIAL/PLATELET
Abs Immature Granulocytes: 0.35 10*3/uL — ABNORMAL HIGH (ref 0.00–0.07)
Basophils Absolute: 0.1 10*3/uL (ref 0.0–0.1)
Basophils Relative: 1 %
Eosinophils Absolute: 0.2 10*3/uL (ref 0.0–0.5)
Eosinophils Relative: 1 %
HCT: 36.8 % (ref 36.0–46.0)
Hemoglobin: 11.1 g/dL — ABNORMAL LOW (ref 12.0–15.0)
Immature Granulocytes: 2 %
Lymphocytes Relative: 52 %
Lymphs Abs: 8.7 10*3/uL — ABNORMAL HIGH (ref 0.7–4.0)
MCH: 33.8 pg (ref 26.0–34.0)
MCHC: 30.2 g/dL (ref 30.0–36.0)
MCV: 112.2 fL — ABNORMAL HIGH (ref 80.0–100.0)
Monocytes Absolute: 1.2 10*3/uL — ABNORMAL HIGH (ref 0.1–1.0)
Monocytes Relative: 7 %
Neutro Abs: 6.2 10*3/uL (ref 1.7–7.7)
Neutrophils Relative %: 37 %
Platelets: 135 10*3/uL — ABNORMAL LOW (ref 150–400)
RBC: 3.28 MIL/uL — ABNORMAL LOW (ref 3.87–5.11)
RDW: 15.3 % (ref 11.5–15.5)
Smear Review: NORMAL
WBC: 16.7 10*3/uL — ABNORMAL HIGH (ref 4.0–10.5)
nRBC: 0.3 % — ABNORMAL HIGH (ref 0.0–0.2)

## 2022-01-07 LAB — COMPREHENSIVE METABOLIC PANEL
ALT: 33 U/L (ref 0–44)
AST: 59 U/L — ABNORMAL HIGH (ref 15–41)
Albumin: 3.1 g/dL — ABNORMAL LOW (ref 3.5–5.0)
Alkaline Phosphatase: 61 U/L (ref 38–126)
Anion gap: 32 — ABNORMAL HIGH (ref 5–15)
BUN: 20 mg/dL (ref 8–23)
CO2: 10 mmol/L — ABNORMAL LOW (ref 22–32)
Calcium: 9.4 mg/dL (ref 8.9–10.3)
Chloride: 107 mmol/L (ref 98–111)
Creatinine, Ser: 1.28 mg/dL — ABNORMAL HIGH (ref 0.44–1.00)
GFR, Estimated: 42 mL/min — ABNORMAL LOW (ref >60)
Glucose, Bld: 186 mg/dL — ABNORMAL HIGH (ref 70–99)
Potassium: 4.9 mmol/L (ref 3.5–5.1)
Sodium: 149 mmol/L — ABNORMAL HIGH (ref 135–145)
Total Bilirubin: 0.7 mg/dL (ref 0.3–1.2)
Total Protein: 5.8 g/dL — ABNORMAL LOW (ref 6.5–8.1)

## 2022-01-07 LAB — LIPASE, BLOOD: Lipase: 50 U/L (ref 11–51)

## 2022-01-07 LAB — TROPONIN I (HIGH SENSITIVITY): Troponin I (High Sensitivity): 142 ng/L (ref <18)

## 2022-01-07 MED ORDER — ROCURONIUM BROMIDE 10 MG/ML (PF) SYRINGE
PREFILLED_SYRINGE | INTRAVENOUS | Status: AC
  Filled 2022-01-07: qty 10

## 2022-01-07 MED ORDER — ETOMIDATE 2 MG/ML IV SOLN
INTRAVENOUS | Status: AC
  Filled 2022-01-07: qty 20

## 2022-01-07 MED ORDER — ATROPINE SULFATE 1 MG/10ML IJ SOSY
PREFILLED_SYRINGE | INTRAMUSCULAR | Status: AC
  Administered 2022-01-07 (×2): 0.05 mg
  Filled 2022-01-07: qty 10

## 2022-01-07 MED ORDER — NOREPINEPHRINE 4 MG/250ML-% IV SOLN: 0.0000 ug/min | INTRAVENOUS | Status: DC

## 2022-01-07 MED ORDER — VECURONIUM BROMIDE 10 MG IV SOLR
INTRAVENOUS | Status: AC
  Filled 2022-01-07: qty 10

## 2022-01-07 MED ORDER — FENTANYL CITRATE (PF) 100 MCG/2ML IJ SOLN
INTRAMUSCULAR | Status: AC | PRN
  Administered 2022-01-06 – 2022-01-07 (×2): 50 ug via INTRAVENOUS

## 2022-01-07 MED ORDER — SODIUM BICARBONATE 8.4 % IV SOLN
INTRAVENOUS | Status: DC | PRN
  Administered 2022-01-06 – 2022-01-07 (×2): 50 meq via INTRAVENOUS

## 2022-01-07 MED ORDER — SUCCINYLCHOLINE CHLORIDE 200 MG/10ML IV SOSY
PREFILLED_SYRINGE | INTRAVENOUS | Status: AC
  Filled 2022-01-07: qty 10

## 2022-01-07 MED ORDER — NOREPINEPHRINE 4 MG/250ML-% IV SOLN
INTRAVENOUS | Status: AC
  Filled 2022-01-07: qty 250

## 2022-01-08 LAB — PATHOLOGIST SMEAR REVIEW: Path Review: NEGATIVE

## 2022-01-19 NOTE — ED Notes (Signed)
[facility] FENTANYL BOLUS PER PROVIDER DR. PALUMBO

## 2022-01-19 NOTE — ED Notes (Signed)
[facility] F[facility] IVP BOLUS BY JESSICA, RN - ORDERED BY DR Randal Buba

## 2022-01-19 NOTE — ED Notes (Signed)
Patient transported to CT escorted by this nurse, RT Tawanna Sat, charge nurse Lattie Haw, ED tech and Dr. Randal Buba -- assisted breathing via BVM in progress for xport

## 2022-01-19 NOTE — ED Provider Notes (Signed)
[facility] Provider Note   CSN: [id number] Arrival date & time: 01/05/2022  2247     History  Chief Complaint  Patient presents with   Abdominal Pain    Jennifer Hardin is a 81 y.o. female.  The history is provided by the EMS personnel. The history is limited by the condition of the patient.  Abdominal Pain Pain location:  LLQ Pain quality comment:  Severe Pain severity:  Severe Onset quality:  Sudden Duration:  1 hour (1 hour prior to arrival) Timing:  Constant Progression:  Unchanged Chronicity:  New Context: not alcohol use   Relieved by:  Nothing Worsened by:  Nothing Ineffective treatments:  None tried Associated symptoms comment:  Large BM Risk factors comment:  Elderly on eliquis Patient with AFIB on Eliquis with sudden onset LLQ pain which is severe and causing the patient to writhe.  EMS transported to [facility].  Patient arrived at 1050 pm.      Home Medications Prior to Admission medications   Medication Sig Start Date End Date Taking? Authorizing Provider  apixaban (ELIQUIS) 5 MG TABS tablet Take 5 mg by mouth 2 (two) times daily.    [provider]  aspirin EC 81 MG tablet Take 81 mg by mouth daily.    [provider]  Azelastine-Fluticasone 137-50 MCG/ACT SUSP Place 1 spray into the nose 2 (two) times daily as needed. 07/29/19   Bobbitt, Sedalia Muta, MD  fluticasone (FLONASE) 50 MCG/ACT nasal spray Place 2 sprays into both nostrils daily.    [provider]  Magnesium 250 MG TABS Take 1 tablet by mouth daily.    [provider]  montelukast (SINGULAIR) 10 MG tablet Take 10 mg by mouth daily. 10/05/16   [provider]  Multiple Vitamin (MULTIVITAMIN) tablet Take 1 tablet by mouth daily.    [provider]  nebivolol (BYSTOLIC) 5 MG tablet Take 5 mg by mouth daily.    [provider]  olmesartan (BENICAR) 40 MG tablet Take 40 mg by mouth daily. 09/25/16   [provider]  Omega-3 Fatty Acids (FISH OIL) 1200 MG CAPS Take 1 capsule by mouth daily.    [provider]  potassium chloride SA (K-DUR,KLOR-CON) 20 MEQ tablet Take 20 mEq by mouth daily. 09/25/16   [provider]  predniSONE (DELTASONE) 5 MG tablet Take 5 mg by mouth daily. 11/02/16   [provider]  sertraline (ZOLOFT) 100 MG tablet Take 100 mg by mouth daily. 09/25/16   [provider]  Turmeric 400 MG CAPS Take 2 capsules by mouth daily.    [provider]      Allergies    Bee venom, Statins, and Sulfa antibiotics    Review of Systems   Review of Systems  Unable to perform ROS: Acuity of condition  Gastrointestinal:  Positive for abdominal pain.    Physical Exam Updated Vital Signs BP (!) 78/34   Pulse 93   Resp 15   Ht 5\' 7"  (1.702 m)   Wt 90.7 kg   SpO2 (!) 85%   BMI 31.32 kg/m  Physical Exam Vitals and nursing note reviewed. Exam conducted with a chaperone present.  Constitutional:      General: She is in acute distress.     Appearance: She is obese.  HENT:     Head: Normocephalic and atraumatic.     Nose: Nose normal.  Eyes:     Comments: Pale   Cardiovascular:  Rate and Rhythm: Tachycardia present. Rhythm irregular.     Pulses: Normal pulses.     Heart sounds: Normal heart sounds.  Pulmonary:     Effort: Pulmonary effort is normal.     Breath sounds: Normal breath sounds.  Abdominal:     Palpations: Abdomen is soft.     Tenderness: There is abdominal tenderness. There is guarding. There is no rebound.  Genitourinary:    Rectum: Guaiac result negative.  Musculoskeletal:     Cervical back: Normal range of motion and neck supple.     Right lower leg: No edema.     Left lower leg: No edema.  Skin:    General: Skin is warm and dry.     Capillary Refill: Capillary refill takes less than 2 seconds.     Coloration: Skin is pale.  Neurological:     Deep Tendon Reflexes: Reflexes normal.     ED Results /  Procedures / Treatments   Labs (all labs ordered are listed, but only abnormal results are displayed) Results for orders placed or performed during the hospital encounter of 12/27/2021  CBC with Differential  Result Value Ref Range   WBC 16.7 (H) 4.0 - 10.5 K/uL   RBC 3.28 (L) 3.87 - 5.11 MIL/uL   Hemoglobin 11.1 (L) 12.0 - 15.0 g/dL   HCT 36.8 36.0 - 46.0 %   MCV 112.2 (H) 80.0 - 100.0 fL   MCH 33.8 26.0 - 34.0 pg   MCHC 30.2 30.0 - 36.0 g/dL   RDW 15.3 11.5 - 15.5 %   Platelets 135 (L) 150 - 400 K/uL   nRBC 0.3 (H) 0.0 - 0.2 %   Neutrophils Relative % 37 %   Neutro Abs 6.2 1.7 - 7.7 K/uL   Lymphocytes Relative 52 %   Lymphs Abs 8.7 (H) 0.7 - 4.0 K/uL   Monocytes Relative 7 %   Monocytes Absolute 1.2 (H) 0.1 - 1.0 K/uL   Eosinophils Relative 1 %   Eosinophils Absolute 0.2 0.0 - 0.5 K/uL   Basophils Relative 1 %   Basophils Absolute 0.1 0.0 - 0.1 K/uL   WBC Morphology MORPHOLOGY UNREMARKABLE    Smear Review Normal platelet morphology    Immature Granulocytes 2 %   Abs Immature Granulocytes 0.35 (H) 0.00 - 0.07 K/uL   Burr Cells PRESENT   Comprehensive metabolic panel  Result Value Ref Range   Sodium 149 (H) 135 - 145 mmol/L   Potassium 4.9 3.5 - 5.1 mmol/L   Chloride 107 98 - 111 mmol/L   CO2 10 (L) 22 - 32 mmol/L   Glucose, Bld 186 (H) 70 - 99 mg/dL   BUN 20 8 - 23 mg/dL   Creatinine, Ser 1.28 (H) 0.44 - 1.00 mg/dL   Calcium 9.4 8.9 - 10.3 mg/dL   Total Protein 5.8 (L) 6.5 - 8.1 g/dL   Albumin 3.1 (L) 3.5 - 5.0 g/dL   AST 59 (H) 15 - 41 U/L   ALT 33 0 - 44 U/L   Alkaline Phosphatase 61 38 - 126 U/L   Total Bilirubin 0.7 0.3 - 1.2 mg/dL   GFR, Estimated 42 (L) >60 mL/min   Anion gap 32 (H) 5 - 15  Occult blood card to lab, stool  Result Value Ref Range   Fecal Occult Bld NEGATIVE NEGATIVE  Lipase, blood  Result Value Ref Range   Lipase 50 11 - 51 U/L  CBG monitoring, ED  Result Value Ref Range   Glucose-Capillary 177 (  H) 70 - 99 mg/dL  Type and screen Ordered  by PROVIDER DEFAULT  Result Value Ref Range   ABO/RH(D) NN^NOT NEEDED    Antibody Screen NOT NEEDED    Sample Expiration      01-13-22,2359 Performed at [facility], [place] [address], [place], [place] [address]    Unit Number [id number]    Blood Component Type RED CELLS,LR    Unit division 00    Status of Unit REL FROM [facility]    Transfusion Status NOT NEEDED    Crossmatch Result NOT NEEDED    Unit Number [id number]    Blood Component Type RED CELLS,LR    Unit division 00    Status of Unit ISSUED    Transfusion Status NOT NEEDED    Crossmatch Result NOT NEEDED   BPAM RBC  Result Value Ref Range   ISSUE DATE / TIME 416606301601    Blood Product Unit Number [id number]    PRODUCT CODE [id number]    Unit Type and Rh 9500    Blood Product Expiration Date 762831517616    ISSUE DATE / TIME 073710626948    Blood Product Unit Number [id number]    PRODUCT CODE [id number]    Unit Type and Rh 9500    Blood Product Expiration Date 696789381017   Troponin I (High Sensitivity)  Result Value Ref Range   Troponin I (High Sensitivity) 142 (HH) <18 ng/L   CT Angio Chest/Abd/Pel for Dissection W and/or Wo Contrast  Result Date: 13-Jan-2022 CLINICAL DATA:  Acute aortic syndrome (AAS) suspected. Severe abdominal pain EXAM: CT ANGIOGRAPHY CHEST, ABDOMEN AND PELVIS TECHNIQUE: Non-contrast CT of the chest was initially obtained. Multidetector CT imaging through the chest, abdomen and pelvis was performed using the standard protocol during bolus administration of intravenous contrast. Multiplanar reconstructed images and MIPs were obtained and reviewed to evaluate the vascular anatomy. RADIATION DOSE REDUCTION: This exam was performed according to the departmental dose-optimization program which includes automated exposure control, adjustment of the mA and/or kV according to patient size and/or use of iterative reconstruction technique. CONTRAST:  131mL OMNIPAQUE IOHEXOL 350 MG/ML SOLN  COMPARISON:  None Available. FINDINGS: CTA CHEST FINDINGS Cardiovascular: Administered IV contrast layers in the SVC, right heart, and central and left pulmonary arteries compatible with poor cardiac output, possible cardiac arrest. Aorta is non-opacified. Aorta calcified, nonaneurysmal. Cannot assess for dissection. Mediastinum/Nodes: No mediastinal, hilar, or axillary adenopathy. Endotracheal tube tip at the level of the carina directed toward the right mainstem bronchus. NG tube in the esophagus with the tip near the GE junction. Lungs/Pleura: 8 mm nodule in the left upper lobe on image 21. Bibasilar atelectasis. No effusions. Musculoskeletal: Chest wall soft tissues are unremarkable. Anterior left 3rd and 4th rib fractures. Review of the MIP images confirms the above findings. CTA ABDOMEN AND PELVIS FINDINGS VASCULAR Aorta: Aorta is heavily calcified. Within the infrarenal aorta, the calcium becomes displaced concerning for ruptured abdominal aortic aneurysm. Displaced calcifications span over approximately 5 cm. Extensive surrounding retroperitoneal blood/hematoma. Celiac: Not visualized SMA: Not visualized Renals: Not visualized IMA: Not visualized Inflow: Heavily calcified iliac vessels, nonaneurysmal. Veins: Contrast material has refluxed from the heart into the IVC and renal veins and passing further into the iliac veins concerning for cardiac arrest. Review of the MIP images confirms the above findings. NON-VASCULAR Hepatobiliary: Reflux of contrast into the hepatic veins. No focal hepatic abnormality. Gallbladder unremarkable. Pancreas: No focal abnormality or ductal dilatation. 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 Normal  size. Adrenals/Urinary Tract: Adrenal glands difficult to visualize due to retroperitoneal hemorrhage. Large cyst in the midpole of the left kidney measuring 4.7 cm. No follow-up imaging recommended. No hydronephrosis. Urinary bladder  grossly unremarkable. Stomach/Bowel: Stomach, large and small bowel grossly unremarkable. Lymphatic: No adenopathy visualized. Evaluation limited due to the large retroperitoneal hematoma. Reproductive: Uterus and adnexa unremarkable. No mass. Pessary in place. Other: No free air. Musculoskeletal: No acute bony abnormality. Review of the MIP images confirms the above findings. IMPRESSION: Layering of intravenous contrast in the SVC, right heart and pulmonary arteries with reflux of contrast into the IVC, renal veins, and iliac veins. Findings compatible with poor heart output and concerning for cardiac arrest. Irregular, displaced aortic calcifications in the infrarenal abdominal aorta with extensive surrounding large retroperitoneal hematoma. Findings concerning for ruptured abdominal aortic aneurysm. Large retroperitoneal hematoma extends from the upper abdomen and into the left pelvis. Endotracheal tube tip at the level of the carina directed toward the right mainstem bronchus. This could be retracted 2-3 cm for optimal positioning. NG tube tip is in the distal esophagus. This could be advanced several cm. Bibasilar atelectasis. Critical Value/emergent results were called by telephone at the time of interpretation on January 28, 2022 at 1:11 am to provider [facility] , who verbally acknowledged these results. Electronically Signed   By: Charlett Nose M.D.   On: 01/28/22 01:12   DG Chest Portable 1 View  Result Date: Jan 28, 2022 CLINICAL DATA:  Intubated EXAM: PORTABLE CHEST 1 VIEW COMPARISON:  None Available. FINDINGS: Two images are submitted. Image at 11:36 p.m. demonstrates endotracheal tube tip about a cm superior to carina. There is cardiomegaly and low lung volume. Image at 11:45 p.m. demonstrates endotracheal tube tip about a cm superior to the carina. Esophageal tube tip in the region of distal esophagus, side-port in the region of distal third of the esophagus. Airspace disease at the left base.  Cardiomegaly with aortic atherosclerosis IMPRESSION: 1. Endotracheal tube tip about a cm superior to the carina 2. Esophageal tube side-port in the region of distal third of esophagus, suggest further advancement for more optimal positioning 3. Cardiomegaly.  Airspace disease suspected at the left base. Electronically Signed   By: Jasmine Pang M.D.   On: 01-28-2022 00:18    EKG EKG Interpretation  Date/Time:  Thursday January 06 2022 23:21:28 EDT Ventricular Rate:  138 PR Interval:    QRS Duration: 170 QT Interval:  321 QTC Calculation: 472 R Axis:   56 Text Interpretation: Atrial fibrillation Confirmed by Karon Cotterill ([id number]) on 01/17/2022 11:23:16 PM  Radiology CT Angio Chest/Abd/Pel for Dissection W and/or Wo Contrast  Result Date: Jan 28, 2022 CLINICAL DATA:  Acute aortic syndrome (AAS) suspected. Severe abdominal pain EXAM: CT ANGIOGRAPHY CHEST, ABDOMEN AND PELVIS TECHNIQUE: Non-contrast CT of the chest was initially obtained. Multidetector CT imaging through the chest, abdomen and pelvis was performed using the standard protocol during bolus administration of intravenous contrast. Multiplanar reconstructed images and MIPs were obtained and reviewed to evaluate the vascular anatomy. RADIATION DOSE REDUCTION: This exam was performed according to the departmental dose-optimization program which includes automated exposure control, adjustment of the mA and/or kV according to patient size and/or use of iterative reconstruction technique. CONTRAST:  OMNIPAQUE IOHEXOL 350 MG/ML SOLN COMPARISON:  None Available. FINDINGS: CTA CHEST FINDINGS Cardiovascular: Administered IV contrast layers in the SVC, right heart, and central and left pulmonary arteries compatible with poor cardiac output, possible cardiac arrest. Aorta is non-opacified. Aorta calcified, nonaneurysmal. Cannot assess for dissection. Mediastinum/Nodes: No  mediastinal, hilar, or axillary adenopathy. Endotracheal tube tip at the  level of the carina directed toward the right mainstem bronchus. NG tube in the esophagus with the tip near the GE junction. Lungs/Pleura: 8 mm nodule in the left upper lobe on image 21. Bibasilar atelectasis. No effusions. Musculoskeletal: Chest wall soft tissues are unremarkable. Anterior left 3rd and 4th rib fractures. Review of the MIP images confirms the above findings. CTA ABDOMEN AND PELVIS FINDINGS VASCULAR Aorta: Aorta is heavily calcified. Within the infrarenal aorta, the calcium becomes displaced concerning for ruptured abdominal aortic aneurysm. Displaced calcifications span over approximately 5 cm. Extensive surrounding retroperitoneal blood/hematoma. Celiac: Not visualized SMA: Not visualized Renals: Not visualized IMA: Not visualized Inflow: Heavily calcified iliac vessels, nonaneurysmal. Veins: Contrast material has refluxed from the heart into the IVC and renal veins and passing further into the iliac veins concerning for cardiac arrest. Review of the MIP images confirms the above findings. NON-VASCULAR Hepatobiliary: Reflux of contrast into the hepatic veins. No focal hepatic abnormality. Gallbladder unremarkable. Pancreas: No focal abnormality or ductal dilatation. 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 Normal size. Adrenals/Urinary Tract: Adrenal glands difficult to visualize due to retroperitoneal hemorrhage. Large cyst in the midpole of the left kidney measuring 4.7 cm. No follow-up imaging recommended. No hydronephrosis. Urinary bladder grossly unremarkable. Stomach/Bowel: Stomach, large and small bowel grossly unremarkable. Lymphatic: No adenopathy visualized. Evaluation limited due to the large retroperitoneal hematoma. Reproductive: Uterus and adnexa unremarkable. No mass. Pessary in place. Other: No free air. Musculoskeletal: No acute bony abnormality. Review of the MIP images confirms the above findings. IMPRESSION: Layering of intravenous  contrast in the SVC, right heart and pulmonary arteries with reflux of contrast into the IVC, renal veins, and iliac veins. Findings compatible with poor heart output and concerning for cardiac arrest. Irregular, displaced aortic calcifications in the infrarenal abdominal aorta with extensive surrounding large retroperitoneal hematoma. Findings concerning for ruptured abdominal aortic aneurysm. Large retroperitoneal hematoma extends from the upper abdomen and into the left pelvis. Endotracheal tube tip at the level of the carina directed toward the right mainstem bronchus. This could be retracted 2-3 cm for optimal positioning. NG tube tip is in the distal esophagus. This could be advanced several cm. Bibasilar atelectasis. Critical Value/emergent results were called by telephone at the time of interpretation on 01/18/22 at 1:11 am to provider [facility] , who verbally acknowledged these results. Electronically Signed   By: Rolm Baptise M.D.   On: 01-18-22 01:12   DG Chest Portable 1 View  Result Date: 01-18-2022 CLINICAL DATA:  Intubated EXAM: PORTABLE CHEST 1 VIEW COMPARISON:  None Available. FINDINGS: Two images are submitted. Image at 11:36 p.m. demonstrates endotracheal tube tip about a cm superior to carina. There is cardiomegaly and low lung volume. Image at 11:45 p.m. demonstrates endotracheal tube tip about a cm superior to the carina. Esophageal tube tip in the region of distal esophagus, side-port in the region of distal third of the esophagus. Airspace disease at the left base. Cardiomegaly with aortic atherosclerosis IMPRESSION: 1. Endotracheal tube tip about a cm superior to the carina 2. Esophageal tube side-port in the region of distal third of esophagus, suggest further advancement for more optimal positioning 3. Cardiomegaly.  Airspace disease suspected at the left base. Electronically Signed   By: Donavan Foil M.D.   On: 01-18-2022 00:18    Procedures Procedure Name:  Intubation Date/Time: 01/18/22 1:45 AM  Performed by: Veatrice Kells, MDPre-anesthesia Checklist: Patient identified, Patient being  monitored and Emergency Drugs available Oxygen Delivery Method: Ambu bag Preoxygenation: Pre-oxygenation with 100% oxygen Ventilation: Mask ventilation without difficulty Laryngoscope Size: Glidescope and 4 Grade View: Grade III Tube size: 7.5 mm Number of attempts: 1 Airway Equipment and Method: Patient positioned with wedge pillow Placement Confirmation: ETT inserted through vocal cords under direct vision, Positive ETCO2, CO2 detector and Breath sounds checked- equal and bilateral Secured at: 23 cm Tube secured with: ETT holder Dental Injury: Teeth and Oropharynx as per pre-operative assessment  Difficulty Due To: Difficulty was anticipated    .Critical Care E&M  Performed by: Veatrice Kells, MD Critical care provider statement:    Critical care start time:  01/18/2022 11:22 PM   Critical care end time:  Jan 17, 2022 1:05 AM   Critical care time was exclusive of:  Separately billable procedures and treating other patients   Critical care was necessary to treat or prevent imminent or life-threatening deterioration of the following conditions:  Circulatory failure, cardiac failure, CNS failure or compromise, respiratory failure and shock   Critical care was time spent personally by me on the following activities:  Blood draw for specimens, development of treatment plan with patient or surrogate, ordering and performing treatments and interventions, ordering and review of laboratory studies, ordering and review of radiographic studies, pulse oximetry, re-evaluation of patient's condition, review of old charts, examination of patient, evaluation of patient's response to treatment, discussions with primary provider and discussions with consultants (case d/w Medical Examiner, not an ME case) After initial E/M assessment, critical care services were subsequently  performed that were exclusive of separately billable procedures or treatment.       Medications Ordered in ED Medications  0.9 %  sodium chloride infusion (Manually program via Guardrails IV Fluids) (has no administration in time range)  EPINEPHrine (ADRENALIN) 5 mg in NS 250 mL (0.02 mg/mL) premix infusion (1 mcg/min Intravenous Rate/Dose Change January 17, 2022 0007)  EPINEPHrine NaCl 5-0.9 MG/250ML-% premix infusion (has no administration in time range)  sodium bicarbonate 150 mEq in dextrose 5 % 1,150 mL infusion ( Intravenous New Bag/Given 2022/01/17 0025)  sodium bicarbonate 1 mEq/mL injection (has no administration in time range)  fentaNYL 2582mcg in NS 219mL (83mcg/ml) infusion-PREMIX (25 mcg/hr Intravenous New Bag/Given January 17, 2022 0010)  fentaNYL 10 mcg/ml infusion (has no administration in time range)  succinylcholine (ANECTINE) 200 MG/10ML syringe (has no administration in time range)  vecuronium (NORCURON) 10 MG injection (has no administration in time range)  norepinephrine (LEVOPHED) 4mg  in 229mL (0.016 mg/mL) premix infusion (has no administration in time range)  atropine 1 MG/10ML injection (has no administration in time range)  norepinephrine (LEVOPHED) 4-5 MG/250ML-% infusion SOLN (has no administration in time range)  sodium bicarbonate injection (50 mEq Intravenous Given 01-17-22 0020)  lactated ringers bolus 500 mL (500 mLs Intravenous New Bag/Given 01/17/2022 2355)  EPINEPHrine (ADRENALIN) 1 MG/10ML injection (1 mg Intravenous Given 01/16/2022 2334)  EPINEPHrine (ADRENALIN) 1 MG/10ML injection (1 mg Intravenous Given 01/09/2022 2340)  iohexol (OMNIPAQUE) 350 MG/ML injection 100 mL (100 mLs Intravenous Contrast Given 2022-01-17 0101)    ED Course/ Medical Decision Making/ A&P                           Medical Decision Making Patient BIB EMS for LLQ pain sudden intense with radiation to groin and one large BM   Amount and/or Complexity of Data Reviewed Independent Historian: EMS     Details: Son SEE above  External Data Reviewed: notes.  Details: Previous notes reviewed  Labs: ordered.    Details: All labs reviewed:  white count elevated 16.1, low hemoglobin 11.1, platelet low 135k.  Elevated sodium 149, normal potassium 4.9, creatinine slight elevation 1.38. Normal LFTs, negative fecal occult blood, elevated troponin 142  Radiology: ordered and independent interpretation performed.    Details: Ruptured AAA by me with blood in abdomen  ECG/medicine tests: ordered and independent interpretation performed. Decision-making details documented in ED Course.    Details: AFIB with RVR Discussion of management or test interpretation with external provider(s): ME in [place] not an ME case  214 Case d/w Dr. Dorothea Ogle at [facility] who will certify the death certificate in the am   Risk Prescription drug management. Parenteral controlled substances. Decision regarding hospitalization. Risk Details: Patient went into cardiac arrest 07/07/2320 CPR initiated.  Blood initiated.  EPI given.  RSI initiated.  Bicarb IV given.  Bicarb drip initiated.   EKG 12-Lead   2320   EKG 12-Lead 2321   EKG 12-Lead 2328/07/07   EPINEPHrine 1 mg   Code start Nafis Farnan 19, 2331   Sodium Bicarbonate 50 mEq Jul 08, 2331   CBG monitoring, ED    EKG 12-Lead 2332/07/07   EPINEPHrine 1 mg 2340   EPINEPHrine 1 mg 2340-07-07   EKG 12-Lead 2346   Occult blood card to lab, stool 07-08-2351   EPINEPHrine HCl-NaCl,EPINEPHrine 0.5 mcg/min 2355   Lactated Ringers 500 mL 2359   Type and screen Ordered by PROVIDER DEFAULT   BPAM RBC 10/20   0010   fentaNYL Citrate,fentaNYL Citrate-NaCl 25 mcg/hr 0013   DG Chest Portable 1 View 0020   sodium bicarbonate 150 mEq in dextrose 5 % 1,150... 100 mL/hr   Sodium Bicarbonate 50 mEq 0029   Comprehensive metabolic panel    CBC with Differential    Pathologist smear review 0030   Lipase, blood   Troponin I (High Sensitivity)  0033   Atropine Sulfate 0.05 mg 0038   Atropine Sulfate 0.05 mg 0101   Iohexol 100  mL 0102   CT Angio Chest/Abd/Pel for Dissection W and/or Wo Contrast 0106   Code end, EDP pronounce patient time of death      Critical Care Total time providing critical care: 90 minutes (I was with the patient the entirety of time for 2320/07/07 to 106 am when pronounced and then charted and called ME and patient's PMD for certifying physician)    Final Clinical Impression(s) / ED Diagnoses Final diagnoses:  Ruptured abdominal aortic aneurysm (AAA), unspecified part [facility])   Time of death: 106 am Rx / DC Orders ED Discharge Orders     None         Katlynne Mckercher, 2022-07-08, MD 2022/01/17 [id number]

## 2022-01-19 NOTE — ED Notes (Signed)
Bed Placement informed of release of body to [facility]

## 2022-01-19 NOTE — ED Notes (Signed)
[facility] staff here to obtain patient/ body. Demographic Sheet provided to [facility]. Release form completed

## 2022-01-19 NOTE — ED Notes (Signed)
[facility] on call line contacted to call EDP, [place]

## 2022-01-19 DEATH — deceased
